# Patient Record
Sex: Male | Born: 1990 | Race: White | Hispanic: No | Marital: Single | Smoking: Current every day smoker
Health system: Southern US, Community
[De-identification: ages and names within clinical notes are randomized; demographics above are authoritative.]

## PROBLEM LIST (undated history)

## (undated) HISTORY — PX: FEMUR FRACTURE SURGERY: SHX633

---
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
## 2010-05-09 LAB — URINE CULTURE

## 2010-05-09 LAB — URINALYSIS, MICROSCOPIC ONLY
Bilirubin Urine: NEGATIVE
Hgb urine dipstick: NEGATIVE
Ketones, ur: NEGATIVE mg/dL
Specific Gravity, Urine: 1.02 (ref 1.005–1.030)
Urobilinogen, UA: 1 mg/dL (ref 0.0–1.0)

## 2010-05-09 LAB — CROSSMATCH
ABO/RH(D): O POS
Antibody Screen: NEGATIVE

## 2010-05-09 LAB — CBC
HCT: 31.8 % — ABNORMAL LOW (ref 39.0–52.0)
Hemoglobin: 11.4 g/dL — ABNORMAL LOW (ref 13.0–17.0)
MCHC: 35.3 g/dL (ref 30.0–36.0)
Platelets: 336 10*3/uL (ref 150–400)
RDW: 12.5 % (ref 11.5–15.5)
RDW: 12.6 % (ref 11.5–15.5)
WBC: 10.3 10*3/uL (ref 4.0–10.5)

## 2010-05-09 LAB — LACTIC ACID, PLASMA: Lactic Acid, Venous: 1.7 mmol/L (ref 0.5–2.2)

## 2010-05-09 LAB — PROTIME-INR: Prothrombin Time: 12.3 seconds (ref 11.6–15.2)
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
## 2014-06-13 ENCOUNTER — Ambulatory Visit (INDEPENDENT_AMBULATORY_CARE_PROVIDER_SITE_OTHER): Payer: Worker's Compensation | Admitting: Nurse Practitioner

## 2014-06-13 DIAGNOSIS — Z23 Encounter for immunization: Secondary | ICD-10-CM | POA: Diagnosis not present

## 2014-06-13 NOTE — Progress Notes (Signed)
Tenivac given and tolerated well.

## 2014-06-13 NOTE — Patient Instructions (Signed)
Td Vaccine (Tetanus and Diphtheria): What You Need to Know 1. Why get vaccinated? Tetanus  and diphtheria are very serious diseases. They are rare in the [place] today, but people who do become infected often have severe complications. Td vaccine is used to protect adolescents and adults from both of these diseases. Both tetanus and diphtheria are infections caused by bacteria. Diphtheria spreads from person to person through coughing or sneezing. Tetanus-causing bacteria enter the body through cuts, scratches, or wounds. TETANUS (Lockjaw) causes painful muscle tightening and stiffness, usually all over the body.  It can lead to tightening of muscles in the head and neck so you can't open your mouth, swallow, or sometimes even breathe. Tetanus kills about 1 out of every 5 people who are infected. DIPHTHERIA can cause a thick coating to form in the back of the throat.  It can lead to breathing problems, paralysis, heart failure, and death. Before vaccines, the [place] saw as many as 200,000 cases a year of diphtheria and hundreds of cases of tetanus. Since vaccination began, cases of both diseases have dropped by about 99%. 2. Td vaccine Td vaccine can protect adolescents and adults from tetanus and diphtheria. Td is usually given as a booster dose every 10 years but it can also be given earlier after a severe and dirty wound or burn. Your doctor can give you more information. Td may safely be given at the same time as other vaccines. 3. Some people should not get this vaccine  If you ever had a life-threatening allergic reaction after a dose of any tetanus or diphtheria containing vaccine, OR if you have a severe allergy to any part of this vaccine, you should not get Td. Tell your doctor if you have any severe allergies.  Talk to your doctor if you:  have epilepsy or another nervous system problem,  had severe pain or swelling after any vaccine containing diphtheria or  tetanus,  ever had Guillain Barr Syndrome (GBS),  aren't feeling well on the day the shot is scheduled. 4. Risks of a vaccine reaction With a vaccine, like any medicine, there is a chance of side effects. These are usually mild and go away on their own. Serious side effects are also possible, but are very rare. Most people who get Td vaccine do not have any problems with it. Mild Problems  following Td (Did not interfere with activities)  Pain where the shot was given (about 8 people in 10)  Redness or swelling where the shot was given (about 1 person in 3)  Mild fever (about 1 person in 15)  Headache or Tiredness (uncommon) Moderate Problems following Td (Interfered with activities, but did not require medical attention)  Fever over 102F (rare) Severe Problems  following Td (Unable to perform usual activities; required medical attention)  Swelling, severe pain, bleeding and/or redness in the arm where the shot was given (rare). Problems that could happen after any vaccine:  Brief fainting spells can happen after any medical procedure, including vaccination. Sitting or lying down for about 15 minutes can help prevent fainting, and injuries caused by a fall. Tell your doctor if you feel dizzy, or have vision changes or ringing in the ears.  Severe shoulder pain and reduced range of motion in the arm where a shot was given can happen, very rarely, after a vaccination.  Severe allergic reactions from a vaccine are very rare, estimated at less than 1 in a million doses. If one were to occur,   it would usually be within a few minutes to a few hours after the vaccination. 5. What if there is a serious reaction? What should I look for?  Look for anything that concerns you, such as signs of a severe allergic reaction, very high fever, or behavior changes. Signs of a severe allergic reaction can include hives, swelling of the face and throat, difficulty breathing, a fast heartbeat,  dizziness, and weakness. These would usually start a few minutes to a few hours after the vaccination. What should I do?  If you think it is a severe allergic reaction or other emergency that can't wait, call 9-1-1 or get the person to the nearest hospital. Otherwise, call your doctor.  Afterward, the reaction should be reported to the Vaccine Adverse Event Reporting System (VAERS). Your doctor might file this report, or you can do it yourself through the VAERS web site at www.vaers.hhs.gov, or by calling 1-800-822-7967. VAERS is only for reporting reactions. They do not give medical advice. 6. The National Vaccine Injury Compensation Program The National Vaccine Injury Compensation Program (VICP) is a federal program that was created to compensate people who may have been injured by certain vaccines. Persons who believe they may have been injured by a vaccine can learn about the program and about filing a claim by calling 1-800-338-2382 or visiting the VICP website at www.hrsa.gov/vaccinecompensation. 7. How can I learn more?  Ask your doctor.  Contact your local or state health department.  Contact the Centers for Disease Control and Prevention (CDC):  Call 1-800-232-4636 (1-800-CDC-INFO)  Visit CDC's website at www.cdc.gov/vaccines CDC Td Vaccine Interim VIS (03/07/12) Document Released: 11/15/2005 Document Revised: 06/04/2013 Document Reviewed: 05/02/2013 ExitCare Patient Information 2015 [employer]. This information is not intended to replace advice given to you by your health care provider. Make sure you discuss any questions you have with your health care provider.  

## 2020-06-10 ENCOUNTER — Emergency Department (HOSPITAL_COMMUNITY)
Admission: EM | Admit: 2020-06-10 | Discharge: 2020-06-10 | Disposition: A | Discharge status: Home / Self Care | Payer: Self-pay | Attending: Emergency Medicine | Admitting: Emergency Medicine

## 2020-06-10 ENCOUNTER — Encounter (HOSPITAL_COMMUNITY): Payer: Self-pay | Admitting: *Deleted

## 2020-06-10 ENCOUNTER — Other Ambulatory Visit: Payer: Self-pay

## 2020-06-10 DIAGNOSIS — F17200 Nicotine dependence, unspecified, uncomplicated: Secondary | ICD-10-CM | POA: Insufficient documentation

## 2020-06-10 DIAGNOSIS — F1510 Other stimulant abuse, uncomplicated: Secondary | ICD-10-CM | POA: Insufficient documentation

## 2020-06-10 DIAGNOSIS — F1010 Alcohol abuse, uncomplicated: Secondary | ICD-10-CM | POA: Insufficient documentation

## 2020-06-10 DIAGNOSIS — R000 Tachycardia, unspecified: Secondary | ICD-10-CM | POA: Insufficient documentation

## 2020-06-10 DIAGNOSIS — Y908 Blood alcohol level of 240 mg/100 ml or more: Secondary | ICD-10-CM | POA: Insufficient documentation

## 2020-06-10 DIAGNOSIS — F1910 Other psychoactive substance abuse, uncomplicated: Secondary | ICD-10-CM

## 2020-06-10 DIAGNOSIS — Z20822 Contact with and (suspected) exposure to covid-19: Secondary | ICD-10-CM | POA: Insufficient documentation

## 2020-06-10 DIAGNOSIS — F1110 Opioid abuse, uncomplicated: Secondary | ICD-10-CM | POA: Insufficient documentation

## 2020-06-10 LAB — CBC WITH DIFFERENTIAL/PLATELET
Abs Immature Granulocytes: 0.22 10*3/uL — ABNORMAL HIGH (ref 0.00–0.07)
Basophils Absolute: 0.2 10*3/uL — ABNORMAL HIGH (ref 0.0–0.1)
Basophils Relative: 2 %
Eosinophils Absolute: 0.6 10*3/uL — ABNORMAL HIGH (ref 0.0–0.5)
Eosinophils Relative: 5 %
HCT: 47.3 % (ref 39.0–52.0)
Hemoglobin: 16.2 g/dL (ref 13.0–17.0)
Immature Granulocytes: 2 %
Lymphocytes Relative: 34 %
Lymphs Abs: 3.9 10*3/uL (ref 0.7–4.0)
MCH: 32.6 pg (ref 26.0–34.0)
MCHC: 34.2 g/dL (ref 30.0–36.0)
MCV: 95.2 fL (ref 80.0–100.0)
Monocytes Absolute: 0.7 10*3/uL (ref 0.1–1.0)
Monocytes Relative: 6 %
Neutro Abs: 6.1 10*3/uL (ref 1.7–7.7)
Neutrophils Relative %: 51 %
Platelets: 416 10*3/uL — ABNORMAL HIGH (ref 150–400)
RBC: 4.97 MIL/uL (ref 4.22–5.81)
RDW: 12.7 % (ref 11.5–15.5)
WBC: 11.6 10*3/uL — ABNORMAL HIGH (ref 4.0–10.5)
nRBC: 0 % (ref 0.0–0.2)

## 2020-06-10 LAB — RESP PANEL BY RT-PCR (FLU A&B, COVID) ARPGX2
Influenza A by PCR: NEGATIVE
Influenza B by PCR: NEGATIVE
SARS Coronavirus 2 by RT PCR: NEGATIVE

## 2020-06-10 LAB — COMPREHENSIVE METABOLIC PANEL
ALT: 37 U/L (ref 0–44)
AST: 29 U/L (ref 15–41)
Albumin: 3.9 g/dL (ref 3.5–5.0)
Alkaline Phosphatase: 60 U/L (ref 38–126)
Anion gap: 10 (ref 5–15)
BUN: 5 mg/dL — ABNORMAL LOW (ref 6–20)
CO2: 27 mmol/L (ref 22–32)
Calcium: 8.5 mg/dL — ABNORMAL LOW (ref 8.9–10.3)
Chloride: 103 mmol/L (ref 98–111)
Creatinine, Ser: 0.69 mg/dL (ref 0.61–1.24)
GFR, Estimated: >60 mL/min (ref >60)
Glucose, Bld: 109 mg/dL — ABNORMAL HIGH (ref 70–99)
Potassium: 3.8 mmol/L (ref 3.5–5.1)
Sodium: 140 mmol/L (ref 135–145)
Total Bilirubin: 0.6 mg/dL (ref 0.3–1.2)
Total Protein: 7.6 g/dL (ref 6.5–8.1)

## 2020-06-10 LAB — ETHANOL: Alcohol, Ethyl (B): 277 mg/dL — ABNORMAL HIGH (ref <10)

## 2020-06-10 NOTE — ED Provider Notes (Signed)
[facility] EMERGENCY DEPARTMENT Provider Note   CSN: [id number] Arrival date & time: 06/10/20  1146     History Chief Complaint  Patient presents with  . Addiction Problem    Barry Randolph is a 30 y.o. male.  HPI   Patient with no significant medical history presents to the emergency department with chief complaint of needing to be admitted due to drug abuse.  He endorses that he has been using fentanyl and methamphetamine and he is concerned that if he does not stop he is going to go to jail.  He states that he may or may not be in trouble with the police but he is unable to elaborate.  He denies suicidal or homicidal ideations, denies hallucinations or delusions.  He states the last time he used fentanyl was approximately 2 days ago, states he used to use IV drugs but has not done in a very long time.  He has no other complaints at this time.  Patient denies headaches, fevers, chills, shortness of breath, chest pain, abdominal pain, nausea, vomiting, diarrhea, worsening pedal edema.  History reviewed. No pertinent past medical history.  There are no problems to display for this patient.   Past Surgical History:  Procedure Laterality Date  . FEMUR FRACTURE SURGERY Right    rod and 2 screws in right upper leg       No family history on file.  Social History   Tobacco Use  . Smoking status: Current Every Day Smoker    Packs/day: 2.00  . Smokeless tobacco: Never Used  Vaping Use  . Vaping Use: Never used  Substance Use Topics  . Alcohol use: Yes    Alcohol/week: 84.0 standard drinks    Types: 84 Cans of beer per week    Comment: 12 pack daily  . Drug use: Yes    Types: Methamphetamines    Comment: Fentanyl    Home Medications Prior to Admission medications   Not on File    Allergies    Sulfa antibiotics  Review of Systems   Review of Systems  Constitutional: Negative for chills and fever.  HENT: Negative for congestion.   Respiratory: Negative for  shortness of breath.   Cardiovascular: Negative for chest pain.  Gastrointestinal: Negative for abdominal pain.  Genitourinary: Negative for enuresis.  Musculoskeletal: Negative for back pain.  Skin: Negative for rash.  Neurological: Negative for dizziness.  Hematological: Does not bruise/bleed easily.  Psychiatric/Behavioral: Negative for agitation, hallucinations, self-injury and suicidal ideas. The patient is not nervous/anxious.     Physical Exam Updated Vital Signs BP 122/81   Pulse (!) 116   Temp 98.1 F (36.7 C) (Oral)   Resp 20   Ht 5\' 2"  (1.575 m)   Wt 63.5 kg   SpO2 98%   BMI 25.61 kg/m   Physical Exam Vitals and nursing note reviewed.  Constitutional:      General: He is not in acute distress.    Appearance: He is not ill-appearing.  HENT:     Head: Normocephalic and atraumatic.     Nose: No congestion.  Eyes:     Conjunctiva/sclera: Conjunctivae normal.  Cardiovascular:     Rate and Rhythm: Regular rhythm. Tachycardia present.     Pulses: Normal pulses.     Heart sounds: No murmur heard. No friction rub. No gallop.   Pulmonary:     Effort: No respiratory distress.     Breath sounds: No wheezing, rhonchi or rales.  Abdominal:  Palpations: Abdomen is soft.     Tenderness: There is no abdominal tenderness.  Musculoskeletal:     Right lower leg: No edema.     Left lower leg: No edema.  Skin:    General: Skin is warm and dry.  Neurological:     Mental Status: He is alert.  Psychiatric:        Mood and Affect: Mood normal.     ED Results / Procedures / Treatments   Labs (all labs ordered are listed, but only abnormal results are displayed) Labs Reviewed  COMPREHENSIVE METABOLIC PANEL - Abnormal; Notable for the following components:      Result Value   Glucose, Bld 109 (*)    BUN 5 (*)    Calcium 8.5 (*)    All other components within normal limits  ETHANOL - Abnormal; Notable for the following components:   Alcohol, Ethyl (B) 277 (*)     All other components within normal limits  CBC WITH DIFFERENTIAL/PLATELET - Abnormal; Notable for the following components:   WBC 11.6 (*)    Platelets 416 (*)    Eosinophils Absolute 0.6 (*)    Basophils Absolute 0.2 (*)    Abs Immature Granulocytes 0.22 (*)    All other components within normal limits  RESP PANEL BY RT-PCR (FLU A&B, COVID) ARPGX2  RAPID URINE DRUG SCREEN, HOSP PERFORMED    EKG None  Radiology No results found.  Procedures Procedures   Medications Ordered in ED Medications - No data to display  ED Course  I have reviewed the triage vital signs and the nursing notes.  Pertinent labs & imaging results that were available during my care of the patient were reviewed by me and considered in my medical decision making (see chart for details).    MDM Rules/Calculators/A&P                         Initial impression-patient presents with concerns of drug abuse.  He is alert, does not appear to be in acute distress, vital signs shows tachycardia.  Will obtain med screening work-up, consult TTS and reassess.  Work-up-CBC shows leukocytosis 11.6, CMP shows hyperglycemia 109, BUN less than 5, respiratory panel negative, ethanol 277, EKG sinus tach without signs of ischemia  Reassessment nursing staff notified me that patient like to go home.  Went to assess the patient, he states he would like to go home he has been waiting here for more than 5 hours and does not want to wait here any longer.  He denies suicidal homicidal ideations, denies hallucination delusions, he is alert and oriented x4, he is aware that he is leaving against medical advice and he resumes all liability at this time as he is leaving before being fully evaluated.  Rule out- Low suspicion for systemic infection as patient is nontoxic-appearing, no obvious source of infection on my exam.  Patient was noted to be tachycardic but I suspect this may be secondary due to substance abuse as he has alcohol  content of 277.  Low suspicion for ACS as patient has chest pain, shortness of breath, EKG is without signs of ischemia.  Low suspicion for PE as patient denies shortness of breath, pleuritic chest pain, patient has no no edema on my exam, no recent travels, patient is low risk factors.  Low suspicion for intra-abdominal abnormality as patient denies abdominal pain, abdomen soft nontender to palpation.  Low suspicion for psychiatric emergency as patient denies  hallucinations or delusions, denies suicidal or homicidal ideations.  Plan-  Patient is leaving AMA, provide him with outpatient resources for drug rehabilitation, recommend that he comes back if symptoms deteriorate or if he changes his mind.  Vital signs have remained stable, no indication for hospital admission.  Patient given at home care as well strict return precautions.  Patient verbalized that they understood agreed to said plan.   Final Clinical Impression(s) / ED Diagnoses Final diagnoses:  Polysubstance abuse [facility])    Rx / DC Orders ED Discharge Orders    None       Barnie Del 06/10/20 2140    Jacalyn Lefevre, MD 06/11/20 [phone]

## 2020-06-10 NOTE — Discharge Instructions (Addendum)
You are leaving against medical advice, if you are to change her mind please come back to emergency department for a full evaluation.  Come back to the emergency department if you develop chest pain, shortness of breath, severe abdominal pain, uncontrolled nausea, vomiting, diarrhea.

## 2020-06-10 NOTE — ED Notes (Signed)
Pt leaving AMA. Pt stated he did not want to stay any longer.  Chrissie Noa PA went to talk to pt. Pt signed AMA signature and ambulatory up hall to leave facility

## 2020-06-10 NOTE — ED Triage Notes (Signed)
Pt brought in by mother stating "I need help". When asked what he needs help with pt states, "Substance abuse. I take Fentanyl and Methamphetamines. Pt reports last using them 2 days ago. Denies SI/HI.

## 2021-04-26 ENCOUNTER — Emergency Department (HOSPITAL_COMMUNITY): Payer: Self-pay

## 2021-04-26 ENCOUNTER — Inpatient Hospital Stay (HOSPITAL_COMMUNITY)
Admission: EM | Admit: 2021-04-26 | Discharge: 2021-04-27 | DRG: 871 | Discharge status: Against Medical Advice | Payer: Self-pay | Attending: Internal Medicine | Admitting: Internal Medicine

## 2021-04-26 ENCOUNTER — Other Ambulatory Visit: Payer: Self-pay

## 2021-04-26 ENCOUNTER — Encounter (HOSPITAL_COMMUNITY): Payer: Self-pay | Admitting: *Deleted

## 2021-04-26 DIAGNOSIS — F17210 Nicotine dependence, cigarettes, uncomplicated: Secondary | ICD-10-CM | POA: Diagnosis present

## 2021-04-26 DIAGNOSIS — S92345A Nondisplaced fracture of fourth metatarsal bone, left foot, initial encounter for closed fracture: Secondary | ICD-10-CM

## 2021-04-26 DIAGNOSIS — F17200 Nicotine dependence, unspecified, uncomplicated: Secondary | ICD-10-CM

## 2021-04-26 DIAGNOSIS — E872 Acidosis, unspecified: Secondary | ICD-10-CM | POA: Diagnosis present

## 2021-04-26 DIAGNOSIS — R7401 Elevation of levels of liver transaminase levels: Secondary | ICD-10-CM | POA: Diagnosis present

## 2021-04-26 DIAGNOSIS — F1510 Other stimulant abuse, uncomplicated: Secondary | ICD-10-CM

## 2021-04-26 DIAGNOSIS — F1010 Alcohol abuse, uncomplicated: Secondary | ICD-10-CM

## 2021-04-26 DIAGNOSIS — Z716 Tobacco abuse counseling: Secondary | ICD-10-CM

## 2021-04-26 DIAGNOSIS — F1110 Opioid abuse, uncomplicated: Secondary | ICD-10-CM

## 2021-04-26 DIAGNOSIS — F1910 Other psychoactive substance abuse, uncomplicated: Secondary | ICD-10-CM | POA: Diagnosis present

## 2021-04-26 DIAGNOSIS — Z882 Allergy status to sulfonamides status: Secondary | ICD-10-CM

## 2021-04-26 DIAGNOSIS — W1830XA Fall on same level, unspecified, initial encounter: Secondary | ICD-10-CM | POA: Diagnosis present

## 2021-04-26 DIAGNOSIS — R6520 Severe sepsis without septic shock: Secondary | ICD-10-CM | POA: Diagnosis present

## 2021-04-26 DIAGNOSIS — Z7151 Drug abuse counseling and surveillance of drug abuser: Secondary | ICD-10-CM

## 2021-04-26 DIAGNOSIS — J690 Pneumonitis due to inhalation of food and vomit: Secondary | ICD-10-CM

## 2021-04-26 DIAGNOSIS — F10930 Alcohol use, unspecified with withdrawal, uncomplicated: Secondary | ICD-10-CM

## 2021-04-26 DIAGNOSIS — Z20822 Contact with and (suspected) exposure to covid-19: Secondary | ICD-10-CM | POA: Diagnosis present

## 2021-04-26 DIAGNOSIS — R55 Syncope and collapse: Secondary | ICD-10-CM

## 2021-04-26 DIAGNOSIS — F10230 Alcohol dependence with withdrawal, uncomplicated: Secondary | ICD-10-CM

## 2021-04-26 DIAGNOSIS — J181 Lobar pneumonia, unspecified organism: Secondary | ICD-10-CM

## 2021-04-26 DIAGNOSIS — B029 Zoster without complications: Secondary | ICD-10-CM | POA: Diagnosis present

## 2021-04-26 DIAGNOSIS — E538 Deficiency of other specified B group vitamins: Secondary | ICD-10-CM | POA: Diagnosis present

## 2021-04-26 DIAGNOSIS — Z79899 Other long term (current) drug therapy: Secondary | ICD-10-CM

## 2021-04-26 DIAGNOSIS — B028 Zoster with other complications: Secondary | ICD-10-CM

## 2021-04-26 DIAGNOSIS — F1020 Alcohol dependence, uncomplicated: Secondary | ICD-10-CM | POA: Diagnosis present

## 2021-04-26 DIAGNOSIS — A419 Sepsis, unspecified organism: Principal | ICD-10-CM

## 2021-04-26 DIAGNOSIS — J189 Pneumonia, unspecified organism: Secondary | ICD-10-CM

## 2021-04-26 LAB — URINALYSIS, ROUTINE W REFLEX MICROSCOPIC
Bacteria, UA: NONE SEEN
Bilirubin Urine: NEGATIVE
Glucose, UA: 150 mg/dL — AB
Ketones, ur: NEGATIVE mg/dL
Leukocytes,Ua: NEGATIVE
Nitrite: NEGATIVE
Protein, ur: 100 mg/dL — AB
Specific Gravity, Urine: 1.015 (ref 1.005–1.030)
pH: 8 (ref 5.0–8.0)

## 2021-04-26 LAB — BLOOD GAS, VENOUS
Acid-base deficit: 0.4 mmol/L (ref 0.0–2.0)
Bicarbonate: 24.8 mmol/L (ref 20.0–28.0)
Drawn by: 6509
O2 Saturation: 70 %
Patient temperature: 37.6
pCO2, Ven: 43 mmHg — ABNORMAL LOW (ref 44–60)
pH, Ven: 7.37 (ref 7.25–7.43)
pO2, Ven: 42 mmHg (ref 32–45)

## 2021-04-26 LAB — CBC WITH DIFFERENTIAL/PLATELET
Abs Immature Granulocytes: 0.11 10*3/uL — ABNORMAL HIGH (ref 0.00–0.07)
Basophils Absolute: 0.1 10*3/uL (ref 0.0–0.1)
Basophils Relative: 1 %
Eosinophils Absolute: 0.3 10*3/uL (ref 0.0–0.5)
Eosinophils Relative: 1 %
HCT: 46.2 % (ref 39.0–52.0)
Hemoglobin: 15.4 g/dL (ref 13.0–17.0)
Immature Granulocytes: 1 %
Lymphocytes Relative: 8 %
Lymphs Abs: 1.4 10*3/uL (ref 0.7–4.0)
MCH: 31.8 pg (ref 26.0–34.0)
MCHC: 33.3 g/dL (ref 30.0–36.0)
MCV: 95.5 fL (ref 80.0–100.0)
Monocytes Absolute: 1.2 10*3/uL — ABNORMAL HIGH (ref 0.1–1.0)
Monocytes Relative: 6 %
Neutro Abs: 15 10*3/uL — ABNORMAL HIGH (ref 1.7–7.7)
Neutrophils Relative %: 83 %
Platelets: 325 10*3/uL (ref 150–400)
RBC: 4.84 MIL/uL (ref 4.22–5.81)
RDW: 12.8 % (ref 11.5–15.5)
WBC: 18 10*3/uL — ABNORMAL HIGH (ref 4.0–10.5)
nRBC: 0 % (ref 0.0–0.2)

## 2021-04-26 LAB — COMPREHENSIVE METABOLIC PANEL
ALT: 103 U/L — ABNORMAL HIGH (ref 0–44)
AST: 309 U/L — ABNORMAL HIGH (ref 15–41)
Albumin: 3.6 g/dL (ref 3.5–5.0)
Alkaline Phosphatase: 65 U/L (ref 38–126)
Anion gap: 10 (ref 5–15)
BUN: 14 mg/dL (ref 6–20)
CO2: 27 mmol/L (ref 22–32)
Calcium: 9 mg/dL (ref 8.9–10.3)
Chloride: 96 mmol/L — ABNORMAL LOW (ref 98–111)
Creatinine, Ser: 0.95 mg/dL (ref 0.61–1.24)
GFR, Estimated: >60 mL/min (ref >60)
Glucose, Bld: 112 mg/dL — ABNORMAL HIGH (ref 70–99)
Potassium: 3.8 mmol/L (ref 3.5–5.1)
Sodium: 133 mmol/L — ABNORMAL LOW (ref 135–145)
Total Bilirubin: 0.5 mg/dL (ref 0.3–1.2)
Total Protein: 7.9 g/dL (ref 6.5–8.1)

## 2021-04-26 LAB — RESP PANEL BY RT-PCR (FLU A&B, COVID) ARPGX2
Influenza A by PCR: NEGATIVE
Influenza B by PCR: NEGATIVE
SARS Coronavirus 2 by RT PCR: NEGATIVE

## 2021-04-26 LAB — MRSA NEXT GEN BY PCR, NASAL: MRSA by PCR Next Gen: NOT DETECTED

## 2021-04-26 LAB — RAPID URINE DRUG SCREEN, HOSP PERFORMED
Amphetamines: POSITIVE — AB
Barbiturates: NOT DETECTED
Benzodiazepines: NOT DETECTED
Cocaine: NOT DETECTED
Opiates: NOT DETECTED
Tetrahydrocannabinol: POSITIVE — AB

## 2021-04-26 LAB — HEPATITIS PANEL, ACUTE
HCV Ab: NONREACTIVE
Hep A IgM: NONREACTIVE
Hep B C IgM: NONREACTIVE
Hepatitis B Surface Ag: NONREACTIVE

## 2021-04-26 LAB — ETHANOL: Alcohol, Ethyl (B): <10 mg/dL (ref <10)

## 2021-04-26 LAB — LACTIC ACID, PLASMA
Lactic Acid, Venous: 2.7 mmol/L (ref 0.5–1.9)
Lactic Acid, Venous: 2.2 mmol/L (ref 0.5–1.9)

## 2021-04-26 MED ORDER — HEPARIN SODIUM (PORCINE) 5000 UNIT/ML IJ SOLN
5000.0000 [IU] | Freq: Three times a day (TID) | INTRAMUSCULAR | Status: DC
  Administered 2021-04-26 – 2021-04-27 (×2): 5000 [IU] via SUBCUTANEOUS
  Filled 2021-04-26 (×2): qty 1

## 2021-04-26 MED ORDER — SODIUM CHLORIDE 0.9 % IV SOLN
INTRAVENOUS | Status: AC
  Administered 2021-04-26: 1000 mL via INTRAVENOUS

## 2021-04-26 MED ORDER — SODIUM CHLORIDE 0.9 % IV BOLUS
1000.0000 mL | Freq: Once | INTRAVENOUS | Status: AC
  Administered 2021-04-26: 1000 mL via INTRAVENOUS

## 2021-04-26 MED ORDER — THIAMINE HCL 100 MG/ML IJ SOLN
100.0000 mg | Freq: Every day | INTRAMUSCULAR | Status: DC
  Filled 2021-04-26: qty 2

## 2021-04-26 MED ORDER — KETOROLAC TROMETHAMINE 30 MG/ML IJ SOLN
30.0000 mg | Freq: Four times a day (QID) | INTRAMUSCULAR | Status: DC | PRN
  Administered 2021-04-26: 30 mg via INTRAVENOUS
  Filled 2021-04-26: qty 1

## 2021-04-26 MED ORDER — OXYCODONE HCL 5 MG PO TABS: 5.0000 mg | ORAL_TABLET | ORAL | Status: DC | PRN

## 2021-04-26 MED ORDER — LORAZEPAM 1 MG PO TABS
0.0000 mg | ORAL_TABLET | Freq: Four times a day (QID) | ORAL | Status: DC
  Administered 2021-04-26: 1 mg via ORAL
  Administered 2021-04-26: 2 mg via ORAL
  Administered 2021-04-26 – 2021-04-27 (×2): 1 mg via ORAL
  Filled 2021-04-26 (×2): qty 1
  Filled 2021-04-26: qty 2

## 2021-04-26 MED ORDER — SODIUM CHLORIDE 0.9 % IV SOLN
500.0000 mg | INTRAVENOUS | Status: DC
  Administered 2021-04-26: 500 mg via INTRAVENOUS
  Filled 2021-04-26: qty 5

## 2021-04-26 MED ORDER — IOHEXOL 350 MG/ML SOLN
75.0000 mL | Freq: Once | INTRAVENOUS | Status: AC | PRN
  Administered 2021-04-26: 75 mL via INTRAVENOUS

## 2021-04-26 MED ORDER — VITAMIN B-12 100 MCG PO TABS
100.0000 ug | ORAL_TABLET | Freq: Every day | ORAL | Status: DC
  Administered 2021-04-26 – 2021-04-27 (×2): 100 ug via ORAL
  Filled 2021-04-26 (×4): qty 1

## 2021-04-26 MED ORDER — ALBUTEROL SULFATE (2.5 MG/3ML) 0.083% IN NEBU: 2.5000 mg | INHALATION_SOLUTION | RESPIRATORY_TRACT | Status: DC | PRN

## 2021-04-26 MED ORDER — LORAZEPAM 2 MG/ML IJ SOLN
0.0000 mg | Freq: Four times a day (QID) | INTRAMUSCULAR | Status: DC
  Filled 2021-04-26: qty 1

## 2021-04-26 MED ORDER — ACETAMINOPHEN 650 MG RE SUPP: 650.0000 mg | Freq: Four times a day (QID) | RECTAL | Status: DC | PRN

## 2021-04-26 MED ORDER — NICOTINE 21 MG/24HR TD PT24
21.0000 mg | MEDICATED_PATCH | Freq: Every day | TRANSDERMAL | Status: DC
  Administered 2021-04-26 – 2021-04-27 (×2): 21 mg via TRANSDERMAL
  Filled 2021-04-26 (×2): qty 1

## 2021-04-26 MED ORDER — SODIUM CHLORIDE 0.9 % IV SOLN: 2.0000 g | INTRAVENOUS | Status: DC

## 2021-04-26 MED ORDER — ONDANSETRON HCL 4 MG/2ML IJ SOLN
4.0000 mg | Freq: Four times a day (QID) | INTRAMUSCULAR | Status: DC | PRN
  Administered 2021-04-26: 4 mg via INTRAVENOUS
  Filled 2021-04-26: qty 2

## 2021-04-26 MED ORDER — ACETAMINOPHEN 325 MG PO TABS
650.0000 mg | ORAL_TABLET | Freq: Four times a day (QID) | ORAL | Status: DC | PRN
  Administered 2021-04-26 – 2021-04-27 (×2): 650 mg via ORAL
  Filled 2021-04-26 (×2): qty 2

## 2021-04-26 MED ORDER — SODIUM CHLORIDE 0.9 % IV SOLN
2.0000 g | Freq: Once | INTRAVENOUS | Status: AC
  Administered 2021-04-26: 2 g via INTRAVENOUS
  Filled 2021-04-26: qty 20

## 2021-04-26 MED ORDER — LORAZEPAM 2 MG/ML IJ SOLN: 0.0000 mg | Freq: Two times a day (BID) | INTRAMUSCULAR | Status: DC

## 2021-04-26 MED ORDER — ONDANSETRON HCL 4 MG PO TABS: 4.0000 mg | ORAL_TABLET | Freq: Four times a day (QID) | ORAL | Status: DC | PRN

## 2021-04-26 MED ORDER — SODIUM CHLORIDE 0.9 % IV SOLN: INTRAVENOUS | Status: DC

## 2021-04-26 MED ORDER — VALACYCLOVIR HCL 500 MG PO TABS
1000.0000 mg | ORAL_TABLET | Freq: Three times a day (TID) | ORAL | Status: DC
  Administered 2021-04-26 – 2021-04-27 (×3): 1000 mg via ORAL
  Filled 2021-04-26 (×10): qty 2

## 2021-04-26 MED ORDER — LORAZEPAM 1 MG PO TABS: 0.0000 mg | ORAL_TABLET | Freq: Two times a day (BID) | ORAL | Status: DC

## 2021-04-26 MED ORDER — GABAPENTIN 100 MG PO CAPS
100.0000 mg | ORAL_CAPSULE | Freq: Three times a day (TID) | ORAL | Status: DC
Start: 2021-04-26 — End: 2021-04-27
  Administered 2021-04-26 – 2021-04-27 (×2): 100 mg via ORAL
  Filled 2021-04-26 (×2): qty 1

## 2021-04-26 MED ORDER — MELATONIN 3 MG PO TABS
6.0000 mg | ORAL_TABLET | Freq: Every evening | ORAL | Status: DC | PRN
  Administered 2021-04-27: 6 mg via ORAL
  Filled 2021-04-26: qty 2

## 2021-04-26 MED ORDER — THIAMINE HCL 100 MG PO TABS
100.0000 mg | ORAL_TABLET | Freq: Every day | ORAL | Status: DC
  Administered 2021-04-26 – 2021-04-27 (×2): 100 mg via ORAL
  Filled 2021-04-26 (×2): qty 1

## 2021-04-26 MED ORDER — SODIUM CHLORIDE 0.9 % IV BOLUS
500.0000 mL | Freq: Once | INTRAVENOUS | Status: AC
  Administered 2021-04-26: 500 mL via INTRAVENOUS

## 2021-04-26 MED ORDER — CHLORHEXIDINE GLUCONATE CLOTH 2 % EX PADS
6.0000 | MEDICATED_PAD | Freq: Every day | CUTANEOUS | Status: DC
  Administered 2021-04-26: 6 via TOPICAL

## 2021-04-26 NOTE — Plan of Care (Signed)
Discussed with patient plan of care for the evening, pain management and  admission question with some teach back displayed.  Contacted mother and updated her per patient request. ? ?Problem: Education: ?Goal: Knowledge of General Education information will improve ?Description: Including pain rating scale, medication(s)/side effects and non-pharmacologic comfort measures ?Outcome: Progressing ?  ?Problem: Health Behavior/Discharge Planning: ?Goal: Ability to manage health-related needs will improve ?Outcome: Progressing ?  ?

## 2021-04-26 NOTE — Assessment & Plan Note (Signed)
Febrile, tachycardic, with tachypnea and leukocytosis ?BP stable ?Lactic acidosis and transaminitis as well ?2/2 pneumonia seen on CTA ?Rocephin started in ED, add zithromax ?Sputum culture and blood culture pending ?Urine legionella and strep pending ?Check Procal with AM labs ?2L bolus in ED, continue IV hydration ?Tylenol for fever ?Continue to monitor ?

## 2021-04-26 NOTE — Assessment & Plan Note (Signed)
History of pernicious anemia?  ?Patient isn't sure why he was on B12 ?Continue B12 supplement and check B12 level ?If WNL - consider discontinuing B12 ?Hgb stable - likely hemoconcentrated in the setting of sepsis ?

## 2021-04-26 NOTE — Assessment & Plan Note (Addendum)
Vesicular rash on left shoulder ?Reports chicken pox in his youth ?Valtrex started in ED - continue ?Gabapentin added for pain control ?

## 2021-04-26 NOTE — Assessment & Plan Note (Signed)
Patient reports 6 overdoses on heroin in last year - unintentional per his report ?Consult TOC ?Today, THC and meth in UDS  ?Counseled on importance of cessation ?Patient reports that he is already working on quitting, and willing to consider rehab ?

## 2021-04-26 NOTE — H&P (Signed)
?History and Physical  ? ? ?Patient: Barry Randolph [id number] DOB: 08-21-1990 ?DOA: 04/26/2021 ?DOS: the patient was seen and examined on 04/26/2021 ?PCP: Pcp, No  ?Patient coming from: Home ? ?Chief Complaint:  ?Chief Complaint  ?Patient presents with  ? Shoulder Pain  ? ?HPI: Barry Randolph is a 31 y.o. male with medical history significant of with history of alcohol abuse and substance abuse as well as B12 deficiency presents to the ED with a chief complaint of " I thought I got jumped."  Patient reports that he does not remember anything from March 25.  He woke up around midnight and could not get out of bed his foot hurt and his shoulder hurt, as well as back pain.  There was blood surrounding him, it did not appear to be his blood.  Patient presented to ED for these reasons.  Patient reports that his mom had to help him out of bed.  He also reports that when he woke up he started to notice shortness of breath and cough productive of greenish sputum.  He felt feverish as well.  He denies any chest pain, but reports headache that seems to be associated with a cough.  Patient has no other complaints.  Patient reports he drinks 6-12 beers daily.  Since he cannot remember yesterday is hard for him to quantify how many beers he drank, but thinks that it was around 12, and he had twisted teas as well.  He denies any other drug use, but meth and marijuana are in his urine drug screen.  Patient reports that meth would not be out of the ordinary for him, but the last use he remembers was 1 month ago.  Patient denies any IV drug use, but reports that he has overdosed on heroin (snorting) 6 times in the last year.  He reports that these are unintentional overdoses.  He is currently trying to quit.  He is open to talking to social work about a rehab.  Patient reports he smokes 2 packs of cigarettes a day.  ? ?Patient reports that he is vaccinated for COVID, and that he is full code. ? ? ?Review of Systems: As mentioned  in the history of present illness. All other systems reviewed and are negative. ?History reviewed. No pertinent past medical history. ?Past Surgical History:  ?Procedure Laterality Date  ? FEMUR FRACTURE SURGERY Right   ? rod and 2 screws in right upper leg  ? ?Social History:  reports that he has been smoking cigarettes. He has been smoking an average of 2 packs per day. He has never used smokeless tobacco. He reports current alcohol use of about 84.0 standard drinks per week. He reports that he does not currently use drugs after having used the following drugs: Methamphetamines. ? ?Allergies  ?Allergen Reactions  ? Sulfa Antibiotics Hives  ? ? ?Family history: Patient does not know of any chronic diseases in his family aside from alcoholism. ? ?Prior to Admission medications   ?Medication Sig Start Date End Date Taking? Authorizing Provider  ?acetaminophen (TYLENOL) 500 MG tablet Take 1,000 mg by mouth every 6 (six) hours as needed.   Yes [provider]  ?Beta Carotene (VITAMIN A) 25000 UNIT capsule Take 25,000 Units by mouth daily.   Yes [provider]  ?vitamin B-12 (CYANOCOBALAMIN) 100 MCG tablet Take 100 mcg by mouth daily.   Yes [provider]  ? ? ?Physical Exam: ?Vitals:  ? 04/26/21 1434 04/26/21 1500 04/26/21 1600 04/26/21  1623  ?BP:  (!) 153/89 (!) 149/78   ?Pulse:  (!) 109 (!) 115   ?Resp:  (!) 23 (!) 22   ?Temp: 99.6 ?F (37.6 ?C)   (!) 101 ?F (38.3 ?C)  ?TempSrc:    Oral  ?SpO2:  92% 91%   ?Weight:      ?Height:      ? ?1.  General: ?Patient lying supine in bed, head of bed elevated, no acute distress ?  ?2. Psychiatric: ?Alert and oriented x 3, flat affect, behavior normal for situation, pleasant and cooperative with exam ?  ?3. Neurologic: ?Speech and language are normal, face is symmetric, moves all 4 extremities voluntarily, at baseline without acute deficits on limited exam ?  ?4. HEENMT:  ?Head is atraumatic, normocephalic, pupils reactive to light, neck is supple,  trachea is midline, mucous membranes are moist ?  ?5. Respiratory : ?Wheeze present bilaterally, no rhonchi, no rales, no cyanosis, no increase in work of breathing or accessory muscle use ?  ?6. Cardiovascular : ?Heart rate normal, rhythm is regular, no murmurs, rubs or gallops, no peripheral edema, peripheral pulses palpated ?  ?7. Gastrointestinal:  ?Abdomen is soft, nondistended, nontender to palpation bowel sounds active, no masses or organomegaly palpated ?  ?8. Skin:  ?Vesicular rash with erythematous base on the left scapula, does not cross midline ?  ?9.Musculoskeletal:  ?No acute deformities or trauma, no asymmetry in tone, no peripheral edema, peripheral pulses palpated, no tenderness to palpation in the extremities ? ?Data Reviewed: ?In the ED ?Up to 101, heart rate 108-117, respiratory rate 19-24, blood pressure 153/89 ?Lactic acid initially 2.7 and then 2.2 ?Leukocytosis to 18, hemoglobin 15.4, platelets 325 ?Slight hyponatremia 133, slight hypochloremia 96 ?AST 309, ALT 103 ?Negative COVID ?UA is not indicative of UTI ?UDS shows amphetamines and THC ?Chest x-ray shows no radiographic evidence of acute cardiopulmonary process ?CTA chest shows nodular airspace opacities that likely represent pneumonia and reactive lymph nodes ?X-ray left foot shows nondisplaced fracture of the fourth metatarsal head and similar fracture of the third metatarsal ?X-ray shoulder shows no osseous abnormality ?EKG shows sinus tach with a rate of 96, QTc 439 ?Alcohol level less than 10 ? ?Assessment and Plan: ?* Sepsis due to pneumonia [facility] ?Febrile, tachycardic, with tachypnea and leukocytosis ?BP stable ?Lactic acidosis and transaminitis as well ?2/2 pneumonia seen on CTA ?Rocephin started in ED, add zithromax ?Sputum culture and blood culture pending ?Urine legionella and strep pending ?Check Procal with AM labs ?2L bolus in ED, continue IV hydration ?Tylenol for fever ?Continue to monitor ? ?Tobacco use disorder ?Smokes  2 packs per day ?Nicotine patch ordered, advised that we can add a second patch if his cravings are not controlled  ?Advised importance of cessation ? ?B12 deficiency ?History of pernicious anemia?  ?Patient isn't sure why he was on B12 ?Continue B12 supplement and check B12 level ?If WNL - consider discontinuing B12 ?Hgb stable - likely hemoconcentrated in the setting of sepsis ? ?Transaminitis ?Likely multifactorial with alcohol use and sepsis both contributing ?Will check hepatitis panel as well ?Hold hepatotoxic agents when possible ?Trend with CMP in the AM - if no improvement - consider RUQ [place] ? ?Shingles ?Vesicular rash on left shoulder ?Reports chicken pox in his youth ?Valtrex started in ED - continue ?Gabapentin added for pain control ?Continue to monitor ? ?Substance abuse ([place]) ?Patient reports 6 overdoses on heroin in last year - unintentional per his report ?Consult TOC ?Today, THC and meth in  UDS  ?Counseled on importance of cessation ?Patient reports that he is already working on quitting, and willing to consider rehab ? ? ? ? ? Advance Care Planning:   Code Status: Full Code  ? ?Consults: Ortho and TOC ? ?Family Communication: Attempted to call mother Anderson Malta per patient request. No answer. Left VM.  ? ?Severity of Illness: ?The appropriate patient status for this patient is OBSERVATION. Observation status is judged to be reasonable and necessary in order to provide the required intensity of service to ensure the patient's safety. The patient's presenting symptoms, physical exam findings, and initial radiographic and laboratory data in the context of their medical condition is felt to place them at decreased risk for further clinical deterioration. Furthermore, it is anticipated that the patient will be medically stable for discharge from the hospital within 2 midnights of admission.  ? ?Author: ?Rolla Plate, DO ?04/26/2021 4:43 PM ? ?For on call review [email or web address].  ?

## 2021-04-26 NOTE — Assessment & Plan Note (Addendum)
Likely multifactorial with alcohol use and sepsis both contributing ?Viral hepatitis panel negative ?Hold hepatotoxic agents when possible ?RUQ [place] ?

## 2021-04-26 NOTE — ED Triage Notes (Signed)
Pt brought in by [facility] from home with c/o left shoulder pain and left foot swelling. VSS for EMS. Pt reports he was drinking last night and doesn't remember what happened. He woke up around midnight and says his head was covered in blood. Denies any drug use, only alcohol. Daily alcohol consumption of about 12 beers.  ?

## 2021-04-26 NOTE — ED Provider Notes (Signed)
?[facility] ?Provider Note ? ? ?CSN: [id number] ?Arrival date & time: 04/26/21  1009 ? ?  ? ?History ? ?Chief Complaint  ?Patient presents with  ? Shoulder Pain  ? ? ?Barry Randolph is a 31 y.o. male. ? ?HPI ? ?Patient with medical history including polysubstance dependency, alcohol dependency presents to the emergency department with complaint of a fall.  Patient states that he drank a 12 pack of beer last night and thinks he had a fall.  He states he woke up with blood around his face and endorsing severe left shoulder pain and right foot pain.  He states he is unclear of how he fell, he is unclear if he lost consciousness, he has not endorsing any headaches change in vision paresthesias or weakness in the upper or lower extremities, he does not endorse any neck or back pain, denies any chest pain or pleuritic chest pain.  No stomach pains nausea or vomiting.  He denies any suicidal homicidal ideations denies any hallucinations or delusions, states he is never had to be hospitalized for alcohol withdrawals in the past.  He is noting that he has been having a productive cough started today as well as fevers and chills he is concerned that he might have pneumonia.  He also states he has a rash on his left shoulder and thinks he might be shingles rash has been there for last couple days describes a burning-like sensation does not itch has no other rashes anywhere else. ? ?I have reviewed patient's chart he has been seen in the past at different hospitals facilities for polysubstance abuse and suicidal ideations. ? ?Home Medications ?Prior to Admission medications   ?Medication Sig Start Date End Date Taking? Authorizing Provider  ?acetaminophen (TYLENOL) 500 MG tablet Take 1,000 mg by mouth every 6 (six) hours as needed.   Yes [provider]  ?Beta Carotene (VITAMIN A) 25000 UNIT capsule Take 25,000 Units by mouth daily.   Yes [provider]  ?vitamin B-12 (CYANOCOBALAMIN) 100  MCG tablet Take 100 mcg by mouth daily.   Yes [provider]  ?   ? ?Allergies    ?Sulfa antibiotics   ? ?Review of Systems   ?Review of Systems  ?Constitutional:  Positive for chills and fever.  ?Respiratory:  Positive for cough. Negative for shortness of breath.   ?Cardiovascular:  Negative for chest pain.  ?Gastrointestinal:  Negative for abdominal pain, diarrhea, nausea and vomiting.  ?Musculoskeletal:   ?     Left shoulder and right foot pain  ?Skin:  Positive for rash.  ?Neurological:  Negative for headaches.  ? ?Physical Exam ?Updated Vital Signs ?BP (!) 153/89   Pulse (!) 109   Temp (!) 100.6 ?F (38.1 ?C) (Rectal)   Resp (!) 23   Ht 5\' 3"  (1.6 m)   Wt 72.6 kg   SpO2 92%   BMI 28.34 kg/m?  ?Physical Exam ?Vitals and nursing note reviewed.  ?Constitutional:   ?   General: He is not in acute distress. ?   Appearance: He is not ill-appearing.  ?   Comments: Ill-appearing, disheveled  ?HENT:  ?   Head: Normocephalic and atraumatic.  ?   Comments: No deformity of the head present no raccoon eyes or battle sign noted head is nontender to palpation ?   Nose: No congestion.  ?   Mouth/Throat:  ?   Mouth: Mucous membranes are moist.  ?   Pharynx: Oropharynx is clear.  ?  Comments: No trismus no torticollis no oral trauma present. ?Eyes:  ?   Extraocular Movements: Extraocular movements intact.  ?   Conjunctiva/sclera: Conjunctivae normal.  ?   Pupils: Pupils are equal, round, and reactive to light.  ?Cardiovascular:  ?   Rate and Rhythm: Normal rate and regular rhythm.  ?   Pulses: Normal pulses.  ?   Heart sounds: No murmur heard. ?  No friction rub. No gallop.  ?Pulmonary:  ?   Effort: No respiratory distress.  ?   Breath sounds: Rhonchi present. No wheezing or rales.  ?   Comments: No evidence of respiratory distress he is nontachypneic nonhypoxic but O2 sats are in the low 90s, able to speak in full sentences, patient had noted rhonchi worse in the right lower lobe versus the left, slight  expiratory wheeze heard right lung no stridor present. ?Abdominal:  ?   Palpations: Abdomen is soft.  ?   Tenderness: There is no abdominal tenderness. There is no right CVA tenderness or left CVA tenderness.  ?Musculoskeletal:  ?   Comments: Spine was palpated nontender to palpation no step-off deformities noted.  No pelvis instability.  Patient tenderness on the anterior aspect the left humeral head, no tenderness along the left scapula or left clavicle, he has full remission along his left elbow wrist and fingers.  He has full range of motion in the right upper extremity left as well as the lower extremities.  He is tender to patient on his right fourth metatarsal.  Neurovascular fully intact in all 4 extremities.  ?Skin: ?   General: Skin is warm and dry.  ?   Comments: Patient has erythematous rash on his right shoulder does not cross the midline vesicles presents blanches with pressure slightly warm to the touch nontender to palpation no fluctuant induration noted.  No scaling of the skin.  ?Neurological:  ?   Mental Status: He is alert.  ?   Comments: No facial asymmetry no difficulty with word finding , following two-step commands no unilateral weakness present.  ?Psychiatric:     ?   Mood and Affect: Mood normal.  ?   Comments: Patient is responding appropriately does not appear responding to internal stimuli denies suicidal homicidal ideations.  Does not appear to be tremulous on my exam  ? ? ?ED Results / Procedures / Treatments   ?Labs ?(all labs ordered are listed, but only abnormal results are displayed) ?Labs Reviewed  ?LACTIC ACID, PLASMA - Abnormal; Notable for the following components:  ?    Result Value  ? Lactic Acid, Venous 2.7 (*)   ? All other components within normal limits  ?LACTIC ACID, PLASMA - Abnormal; Notable for the following components:  ? Lactic Acid, Venous 2.2 (*)   ? All other components within normal limits  ?COMPREHENSIVE METABOLIC PANEL - Abnormal; Notable for the following  components:  ? Sodium 133 (*)   ? Chloride 96 (*)   ? Glucose, Bld 112 (*)   ? AST 309 (*)   ? ALT 103 (*)   ? All other components within normal limits  ?CBC WITH DIFFERENTIAL/PLATELET - Abnormal; Notable for the following components:  ? WBC 18.0 (*)   ? Neutro Abs 15.0 (*)   ? Monocytes Absolute 1.2 (*)   ? Abs Immature Granulocytes 0.11 (*)   ? All other components within normal limits  ?URINALYSIS, ROUTINE W REFLEX MICROSCOPIC - Abnormal; Notable for the following components:  ? Glucose, UA 150 (*)   ?  Hgb urine dipstick MODERATE (*)   ? Protein, ur 100 (*)   ? All other components within normal limits  ?RAPID URINE DRUG SCREEN, HOSP PERFORMED - Abnormal; Notable for the following components:  ? Amphetamines POSITIVE (*)   ? Tetrahydrocannabinol POSITIVE (*)   ? All other components within normal limits  ?BLOOD GAS, VENOUS - Abnormal; Notable for the following components:  ? pCO2, Ven 43 (*)   ? All other components within normal limits  ?RESP PANEL BY RT-PCR (FLU A&B, COVID) ARPGX2  ?CULTURE, BLOOD (ROUTINE X 2)  ?CULTURE, BLOOD (ROUTINE X 2)  ?ETHANOL  ?HEPATITIS PANEL, ACUTE  ? ? ?EKG ?None ? ?Radiology ?CT Head Wo Contrast ? ?Result Date: 04/26/2021 ?CLINICAL DATA:  Head trauma, intoxicated last night EXAM: CT HEAD WITHOUT CONTRAST CT CERVICAL SPINE WITHOUT CONTRAST TECHNIQUE: Multidetector CT imaging of the head and cervical spine was performed following the standard protocol without intravenous contrast. Multiplanar CT image reconstructions of the cervical spine were also generated. RADIATION DOSE REDUCTION: This exam was performed according to the departmental dose-optimization program which includes automated exposure control, adjustment of the mA and/or kV according to patient size and/or use of iterative reconstruction technique. COMPARISON:  None. FINDINGS: CT HEAD FINDINGS Brain: No evidence of acute infarction, hemorrhage, hydrocephalus, extra-axial collection or mass lesion/mass effect. Vascular: No  hyperdense vessel or unexpected calcification. Skull: Normal. Negative for fracture or focal lesion. Sinuses/Orbits: No acute finding. Other: None. CT CERVICAL SPINE FINDINGS Alignment: Normal. Skull base and verteb

## 2021-04-26 NOTE — ED Notes (Signed)
Pt c/o left foot pain , like something is stuck in it.  ?

## 2021-04-26 NOTE — Assessment & Plan Note (Addendum)
Smokes 2 packs per day ?Nicotine patch ordered ?Advised importance of cessation ?

## 2021-04-27 ENCOUNTER — Observation Stay (HOSPITAL_COMMUNITY): Payer: Self-pay

## 2021-04-27 DIAGNOSIS — S92345A Nondisplaced fracture of fourth metatarsal bone, left foot, initial encounter for closed fracture: Secondary | ICD-10-CM

## 2021-04-27 DIAGNOSIS — F1010 Alcohol abuse, uncomplicated: Secondary | ICD-10-CM

## 2021-04-27 DIAGNOSIS — B029 Zoster without complications: Secondary | ICD-10-CM

## 2021-04-27 DIAGNOSIS — R6520 Severe sepsis without septic shock: Secondary | ICD-10-CM

## 2021-04-27 DIAGNOSIS — A419 Sepsis, unspecified organism: Secondary | ICD-10-CM

## 2021-04-27 DIAGNOSIS — R55 Syncope and collapse: Secondary | ICD-10-CM

## 2021-04-27 DIAGNOSIS — F1510 Other stimulant abuse, uncomplicated: Secondary | ICD-10-CM

## 2021-04-27 DIAGNOSIS — J690 Pneumonitis due to inhalation of food and vomit: Secondary | ICD-10-CM

## 2021-04-27 DIAGNOSIS — F10221 Alcohol dependence with intoxication delirium: Secondary | ICD-10-CM

## 2021-04-27 DIAGNOSIS — J181 Lobar pneumonia, unspecified organism: Secondary | ICD-10-CM

## 2021-04-27 DIAGNOSIS — F1110 Opioid abuse, uncomplicated: Secondary | ICD-10-CM

## 2021-04-27 LAB — BLOOD CULTURE ID PANEL (REFLEXED) - BCID2

## 2021-04-27 LAB — CBC WITH DIFFERENTIAL/PLATELET
Abs Immature Granulocytes: 0.13 10*3/uL — ABNORMAL HIGH (ref 0.00–0.07)
Basophils Absolute: 0.1 10*3/uL (ref 0.0–0.1)
Basophils Relative: 1 %
Eosinophils Absolute: 0.8 10*3/uL — ABNORMAL HIGH (ref 0.0–0.5)
Eosinophils Relative: 4 %
HCT: 36.5 % — ABNORMAL LOW (ref 39.0–52.0)
Hemoglobin: 12 g/dL — ABNORMAL LOW (ref 13.0–17.0)
Immature Granulocytes: 1 %
Lymphocytes Relative: 10 %
Lymphs Abs: 1.8 10*3/uL (ref 0.7–4.0)
MCH: 31.6 pg (ref 26.0–34.0)
MCHC: 32.9 g/dL (ref 30.0–36.0)
MCV: 96.1 fL (ref 80.0–100.0)
Monocytes Absolute: 1.2 10*3/uL — ABNORMAL HIGH (ref 0.1–1.0)
Monocytes Relative: 7 %
Neutro Abs: 13.9 10*3/uL — ABNORMAL HIGH (ref 1.7–7.7)
Neutrophils Relative %: 77 %
Platelets: 272 10*3/uL (ref 150–400)
RBC: 3.8 MIL/uL — ABNORMAL LOW (ref 4.22–5.81)
RDW: 12.8 % (ref 11.5–15.5)
WBC: 17.9 10*3/uL — ABNORMAL HIGH (ref 4.0–10.5)
nRBC: 0 % (ref 0.0–0.2)

## 2021-04-27 LAB — COMPREHENSIVE METABOLIC PANEL
ALT: 79 U/L — ABNORMAL HIGH (ref 0–44)
AST: 210 U/L — ABNORMAL HIGH (ref 15–41)
Albumin: 2.5 g/dL — ABNORMAL LOW (ref 3.5–5.0)
Alkaline Phosphatase: 76 U/L (ref 38–126)
Anion gap: 5 (ref 5–15)
BUN: 11 mg/dL (ref 6–20)
CO2: 21 mmol/L — ABNORMAL LOW (ref 22–32)
Calcium: 7.8 mg/dL — ABNORMAL LOW (ref 8.9–10.3)
Chloride: 110 mmol/L (ref 98–111)
Creatinine, Ser: 0.81 mg/dL (ref 0.61–1.24)
GFR, Estimated: >60 mL/min (ref >60)
Glucose, Bld: 101 mg/dL — ABNORMAL HIGH (ref 70–99)
Potassium: 3.9 mmol/L (ref 3.5–5.1)
Sodium: 136 mmol/L (ref 135–145)
Total Bilirubin: 0.8 mg/dL (ref 0.3–1.2)
Total Protein: 5.8 g/dL — ABNORMAL LOW (ref 6.5–8.1)

## 2021-04-27 LAB — EXPECTORATED SPUTUM ASSESSMENT W GRAM STAIN, RFLX TO RESP C

## 2021-04-27 LAB — VITAMIN B12: Vitamin B-12: 238 pg/mL (ref 180–914)

## 2021-04-27 LAB — PROCALCITONIN: Procalcitonin: 3.51 ng/mL

## 2021-04-27 LAB — HIV ANTIBODY (ROUTINE TESTING W REFLEX): HIV Screen 4th Generation wRfx: NONREACTIVE

## 2021-04-27 LAB — PROTIME-INR
INR: 1.1 (ref 0.8–1.2)
Prothrombin Time: 14.1 seconds (ref 11.4–15.2)

## 2021-04-27 LAB — STREP PNEUMONIAE URINARY ANTIGEN: Strep Pneumo Urinary Antigen: NEGATIVE

## 2021-04-27 LAB — LACTIC ACID, PLASMA: Lactic Acid, Venous: 0.9 mmol/L (ref 0.5–1.9)

## 2021-04-27 LAB — MAGNESIUM: Magnesium: 2.3 mg/dL (ref 1.7–2.4)

## 2021-04-27 LAB — CORTISOL-AM, BLOOD: Cortisol - AM: 23.1 ug/dL — ABNORMAL HIGH (ref 6.7–22.6)

## 2021-04-27 MED ORDER — PIPERACILLIN-TAZOBACTAM 3.375 G IVPB
3.3750 g | Freq: Three times a day (TID) | INTRAVENOUS | Status: DC
  Administered 2021-04-27: 3.375 g via INTRAVENOUS
  Filled 2021-04-27: qty 50

## 2021-04-27 MED ORDER — GUAIFENESIN-DM 100-10 MG/5ML PO SYRP
5.0000 mL | ORAL_SOLUTION | ORAL | Status: DC | PRN
  Administered 2021-04-27: 5 mL via ORAL
  Filled 2021-04-27: qty 5

## 2021-04-27 NOTE — Discharge Summary (Signed)
?Physician Discharge Summary ?  ?Patient: Barry Randolph MRN: [id number] DOB: 1990-08-08  ?Admit date:     04/26/2021  ?Discharge date: 04/27/21  ?Discharge Physician: Shanon Brow Ellouise Mcwhirter  ? ?PCP: Pcp, No  ? ?PATIENT LEFT AGAINST MEDICAL ADVICE ?In speaking with [name], Barry Randolph has demonstrated the ability to understand his medical condition(s) which include pneumonia, sepsis, death.  Barry Randolph has demonstrated the ability to appreciate how treatment for pneumonia, sepsis, death will be beneficial.   Barry Randolph has also demonstrated the ability to understand and appreciate how refusal of treatment for pneumonia sepsis death could result in harm, repeat hospitalization, and possibly death.  Barry Randolph demonstrates the ability to reason through the risks and benefits of the proposed treatment.  Finally, [name] is able to clearly communicate his/her choice. ? ? ? ?Hospital Course: ?31 year old male with a history of polysubstance abuse including fentanyl, methamphetamine, alcohol, tobacco, and prior IV heroin use presenting EMS from home.  His initial complaint was left shoulder pain and left foot pain.  Apparently, he told his mother to call the ambulance because of his pain.  The patient does not recall the events of the prior night before he came to the emergency department.  Unfortunately, his mother with whom he lives is a difficult historian at best.  She states that he was with his friends in the house " drinking and probably using drugs".  Out of frustration, his mother essentially left him alone with his friends after an argument.  Apparently, the patient was sitting in a recliner when he was yelling for his mother to call EMS because of his pain.  She did not note any blood or vomitus around him. ?The patient states that he drank eight 20 ounce beers and snorted some fentanyl and that was the last thing he remembered.  He states that he used methamphetamine about 1 week ago.  He  usually snorts fentanyl " a couple times a week" and he drinks alcohol on daily basis.  The patient denies biting his tongue or any bowel or bladder incontinence.  At the time of arrival, the patient had been complaining of some shortness of breath and a cough with yellow-green sputum.  He denied any nausea, vomiting, diarrhea, abdominal pain, dysuria, hematuria. ?In the ED, the patient was febrile to 1 1.0 ?F.  He was tachycardic in the 120s.  Oxygen saturation was 90% on room air.  BMP showed sodium 133, potassium 3.8, bicarbonate 27, serum creatinine 0.95.  AST 39, ALT 103, alk phosphatase 55, total bilirubin 0.5.  WBC 18.0, hemoglobin 15.4, platelets 323.  CTA chest was negative for PE but showed multifocal patchy and nodular opacities in the bilateral lungs, most confluent in LLL.  X-ray of the left shoulder was negative for any osseous abnormalities.  X-ray of the left foot showed nondisplaced fracture of the fourth metatarsal head.  Lactic acid 2.7>> 0.9.  The patient was given IV fluids and started on ceftriaxone and azithromycin. ? ?Assessment and Plan: ?* Severe sepsis ([place]) ?Presented with fever, tachycardia, leukocytosis ?Lactic acid 2.7>> 0.9 ?Secondary to aspiration pneumonia ?Continue IV fluids ?Follow blood cultures ?Start Zosyn ?Continue azithromycin ?PCT 3.51 ? ?Lobar pneumonia ([place]) ?04/26/2021 CTA chest--negative PE; multifocal patchy nodular opacities bilateral lungs, most confluent LLL ?Continue Zosyn and azithromycin ?PCT 3.51 ? ?Aspiration pneumonitis ([place]) ?Continue antibiotics as discussed above ? ?Syncope and collapse ?Secondary to drug/alcohol OD ?No SI/HI ?Hx less suggestive of seizure ?EEG ? ?  Closed nondisplaced fracture of fourth metatarsal bone of left foot ?Conservative care ?Orthopedic shoe ? ?Tobacco use disorder ?Smokes 2 packs per day ?Nicotine patch ordered ?Advised importance of cessation ? ?Transaminitis ?Likely multifactorial with alcohol use and sepsis both  contributing ?Viral hepatitis panel negative ?Hold hepatotoxic agents when possible ?RUQ [place] ? ?Shingles ?Vesicular rash on left shoulder ?Reports chicken pox in his youth ?Valtrex started in ED - continue ?Gabapentin added for pain control ? ?Alcohol dependence ([place]) ?Alcohol withdrawal protocol ? ? ? ? ?DISCHARGE MEDICATION: ?Allergies as of 04/27/2021   ? ?   Reactions  ? Sulfa Antibiotics Hives  ? ?  ? ?  ?Medication List  ?  ? ?ASK your doctor about these medications   ? ?acetaminophen 500 MG tablet ?Commonly known as: TYLENOL ?Take 1,000 mg by mouth every 6 (six) hours as needed. ?  ?vitamin A 25000 UNIT capsule ?Take 25,000 Units by mouth daily. ?  ?vitamin B-12 100 MCG tablet ?Commonly known as: CYANOCOBALAMIN ?Take 100 mcg by mouth daily. ?  ? ?  ? ? ?Discharge Exam: ?Filed Weights  ? 04/26/21 1019 04/26/21 1950 04/27/21 0500  ?Weight: 72.6 kg 75.2 kg 75.2 kg  ? ?HEENT:  [place]/AT, No thrush, no icterus ?CV:  RRR, no rub, no S3, no S4 ?Lung:  bilateral rhonchi ?Abd:  soft/+BS, NT ?Ext:  No edema, no lymphangitis, no synovitis, no rash ? ? ?Condition at discharge:  AMA ? ?The results of significant diagnostics from this hospitalization (including imaging, microbiology, ancillary and laboratory) are listed below for reference.  ? ?Imaging Studies: ?CT Head Wo Contrast ? ?Result Date: 04/26/2021 ?CLINICAL DATA:  Head trauma, intoxicated last night EXAM: CT HEAD WITHOUT CONTRAST CT CERVICAL SPINE WITHOUT CONTRAST TECHNIQUE: Multidetector CT imaging of the head and cervical spine was performed following the standard protocol without intravenous contrast. Multiplanar CT image reconstructions of the cervical spine were also generated. RADIATION DOSE REDUCTION: This exam was performed according to the departmental dose-optimization program which includes automated exposure control, adjustment of the mA and/or kV according to patient size and/or use of iterative reconstruction technique. COMPARISON:  None. FINDINGS: CT  HEAD FINDINGS Brain: No evidence of acute infarction, hemorrhage, hydrocephalus, extra-axial collection or mass lesion/mass effect. Vascular: No hyperdense vessel or unexpected calcification. Skull: Normal. Negative for fracture or focal lesion. Sinuses/Orbits: No acute finding. Other: None. CT CERVICAL SPINE FINDINGS Alignment: Normal. Skull base and vertebrae: No acute fracture. No primary bone lesion or focal pathologic process. Soft tissues and spinal canal: No prevertebral fluid or swelling. No visible canal hematoma. Disc levels:  Intact. Upper chest: Negative. Other: None. IMPRESSION: 1. No acute intracranial pathology. 2. No fracture or static subluxation of the cervical spine. Electronically Signed   By: Delanna Ahmadi M.D.   On: 04/26/2021 14:59  ? ?CT Angio Chest PE W and/or Wo Contrast ? ?Result Date: 04/26/2021 ?CLINICAL DATA:  Pulmonary embolism (PE) suspected, high prob EXAM: CT ANGIOGRAPHY CHEST WITH CONTRAST TECHNIQUE: Multidetector CT imaging of the chest was performed using the standard protocol during bolus administration of intravenous contrast. Multiplanar CT image reconstructions and MIPs were obtained to evaluate the vascular anatomy. RADIATION DOSE REDUCTION: This exam was performed according to the departmental dose-optimization program which includes automated exposure control, adjustment of the mA and/or kV according to patient size and/or use of iterative reconstruction technique. CONTRAST:  53m OMNIPAQUE IOHEXOL 350 MG/ML SOLN COMPARISON:  Same day chest x-ray FINDINGS: Cardiovascular: Satisfactory opacification of the pulmonary arteries to the segmental level. No  evidence of pulmonary embolism. Evaluation slightly degraded by respiratory motion artifact. Thoracic aorta is normal in course and caliber. Normal heart size. No pericardial effusion. Mediastinum/Nodes: Multiple mildly prominent mediastinal and bilateral hilar lymph nodes. No axillary lymphadenopathy. Thyroid, trachea, and  esophagus within normal limits. Lungs/Pleura: Multifocal patchy and nodular airspace opacities throughout both lungs, most confluent within the medial aspect of the left lower lobe. Many of the opacities are mixed [place]

## 2021-04-27 NOTE — Assessment & Plan Note (Signed)
Continue antibiotics as discussed above ?

## 2021-04-27 NOTE — Plan of Care (Signed)
Airborne + Contact isolation initiated per orders for suspected shinlgles. Patient remains on IV antibiotics. ? ? ?Problem: Education: ?Goal: Knowledge of General Education information will improve ?Description: Including pain rating scale, medication(s)/side effects and non-pharmacologic comfort measures ?Outcome: Progressing ?  ?Problem: Health Behavior/Discharge Planning: ?Goal: Ability to manage health-related needs will improve ?Outcome: Progressing ?  ?Problem: Clinical Measurements: ?Goal: Ability to maintain clinical measurements within normal limits will improve ?Outcome: Progressing ?Goal: Will remain free from infection ?Outcome: Progressing ?Goal: Diagnostic test results will improve ?Outcome: Progressing ?Goal: Respiratory complications will improve ?Outcome: Progressing ?Goal: Cardiovascular complication will be avoided ?Outcome: Progressing ?  ?Problem: Activity: ?Goal: Risk for activity intolerance will decrease ?Outcome: Progressing ?  ?Problem: Nutrition: ?Goal: Adequate nutrition will be maintained ?Outcome: Progressing ?  ?Problem: Coping: ?Goal: Level of anxiety will decrease ?Outcome: Progressing ?  ?Problem: Elimination: ?Goal: Will not experience complications related to bowel motility ?Outcome: Progressing ?Goal: Will not experience complications related to urinary retention ?Outcome: Progressing ?  ?Problem: Pain Managment: ?Goal: General experience of comfort will improve ?Outcome: Progressing ?  ?Problem: Safety: ?Goal: Ability to remain free from injury will improve ?Outcome: Progressing ?  ?Problem: Skin Integrity: ?Goal: Risk for impaired skin integrity will decrease ?Outcome: Progressing ?  ?

## 2021-04-27 NOTE — Plan of Care (Addendum)
Patient signed out AMA. ? ?Patient left the unit @ 1235 hrs under his own power and in no obvious distress. ? ?Problem: Education: ?Goal: Knowledge of General Education information will improve ?Description: Including pain rating scale, medication(s)/side effects and non-pharmacologic comfort measures ?04/27/2021 1148 by Jesse Sans, RN ?Outcome: Completed/Met ?04/27/2021 1011 by Jesse Sans, RN ?Outcome: Progressing ?  ?Problem: Health Behavior/Discharge Planning: ?Goal: Ability to manage health-related needs will improve ?04/27/2021 1148 by Jesse Sans, RN ?Outcome: Completed/Met ?04/27/2021 1011 by Jesse Sans, RN ?Outcome: Progressing ?  ?Problem: Clinical Measurements: ?Goal: Ability to maintain clinical measurements within normal limits will improve ?04/27/2021 1148 by Jesse Sans, RN ?Outcome: Completed/Met ?04/27/2021 1011 by Jesse Sans, RN ?Outcome: Progressing ?Goal: Will remain free from infection ?04/27/2021 1148 by Jesse Sans, RN ?Outcome: Completed/Met ?04/27/2021 1011 by Jesse Sans, RN ?Outcome: Progressing ?Goal: Diagnostic test results will improve ?04/27/2021 1148 by Jesse Sans, RN ?Outcome: Completed/Met ?04/27/2021 1011 by Jesse Sans, RN ?Outcome: Progressing ?Goal: Respiratory complications will improve ?04/27/2021 1148 by Jesse Sans, RN ?Outcome: Completed/Met ?04/27/2021 1011 by Jesse Sans, RN ?Outcome: Progressing ?Goal: Cardiovascular complication will be avoided ?04/27/2021 1148 by Jesse Sans, RN ?Outcome: Completed/Met ?04/27/2021 1011 by Jesse Sans, RN ?Outcome: Progressing ?  ?Problem: Activity: ?Goal: Risk for activity intolerance will decrease ?04/27/2021 1148 by Jesse Sans, RN ?Outcome: Completed/Met ?04/27/2021 1011 by Jesse Sans, RN ?Outcome: Progressing ?  ?Problem: Nutrition: ?Goal: Adequate nutrition will be maintained ?04/27/2021 1148 by Jesse Sans, RN ?Outcome: Completed/Met ?04/27/2021 1011 by Jesse Sans,  RN ?Outcome: Progressing ?  ?Problem: Coping: ?Goal: Level of anxiety will decrease ?04/27/2021 1148 by Jesse Sans, RN ?Outcome: Completed/Met ?04/27/2021 1011 by Jesse Sans, RN ?Outcome: Progressing ?  ?Problem: Elimination: ?Goal: Will not experience complications related to bowel motility ?04/27/2021 1148 by Jesse Sans, RN ?Outcome: Completed/Met ?04/27/2021 1011 by Jesse Sans, RN ?Outcome: Progressing ?Goal: Will not experience complications related to urinary retention ?04/27/2021 1148 by Jesse Sans, RN ?Outcome: Completed/Met ?04/27/2021 1011 by Jesse Sans, RN ?Outcome: Progressing ?  ?Problem: Pain Managment: ?Goal: General experience of comfort will improve ?04/27/2021 1148 by Jesse Sans, RN ?Outcome: Completed/Met ?04/27/2021 1011 by Jesse Sans, RN ?Outcome: Progressing ?  ?Problem: Safety: ?Goal: Ability to remain free from injury will improve ?04/27/2021 1148 by Jesse Sans, RN ?Outcome: Completed/Met ?04/27/2021 1011 by Jesse Sans, RN ?Outcome: Progressing ?  ?Problem: Skin Integrity: ?Goal: Risk for impaired skin integrity will decrease ?04/27/2021 1148 by Jesse Sans, RN ?Outcome: Completed/Met ?04/27/2021 1011 by Jesse Sans, RN ?Outcome: Progressing ?  ?

## 2021-04-27 NOTE — TOC Initial Note (Signed)
Transition of Care (TOC) - Initial/Assessment Note  ? ? ?Patient Details  ?Name: Barry Randolph ?MRN: [id number] ?Date of Birth: 11/06/1990 ? ?Transition of Care (TOC) CM/SW Contact:    ?Leitha Bleak, RN ?Phone Number: ?04/27/2021, 12:02 PM ? ?Clinical Narrative:        TOC consulted for substance abuse. Patient on precautions, not answering his phone, TOC called his mother to discuss Substance abuse resource packet. Provided some new options for out patient rehab with [facility]. Packet taken to unit to be given with discharge papers.           ? ? ?Expected Discharge Plan: Home/Self Care ?Barriers to Discharge: Continued Medical Work up ? ? ?Patient Goals and CMS Choice ?Patient states their goals for this hospitalization and ongoing recovery are:: to go home. ?CMS Medicare.gov Compare Post Acute Care list provided to:: Patient Represenative (must comment) ?Choice offered to / list presented to : Parent ? ?Expected Discharge Plan and Services ?Expected Discharge Plan: Home/Self Care ?  ? Prior Living Arrangements/Services ?  ?Lives with:: Parents ?  ?Activities of Daily Living ?Home Assistive Devices/Equipment: None ?ADL Screening (condition at time of admission) ?Patient's cognitive ability adequate to safely complete daily activities?: Yes ?Is the patient deaf or have difficulty hearing?: No ?Does the patient have difficulty seeing, even when wearing glasses/contacts?: No ?Does the patient have difficulty concentrating, remembering, or making decisions?: No ?Patient able to express need for assistance with ADLs?: Yes ?Does the patient have difficulty dressing or bathing?: No ?Independently performs ADLs?: Yes (appropriate for developmental age) ?Does the patient have difficulty walking or climbing stairs?: No ?Weakness of Legs: None ?Weakness of Arms/Hands: None ? ?Permission Sought/Granted ?   ? ?Emotional Assessment ?  ?   ?Admission diagnosis:  Herpes zoster without complication [B02.9] ?Closed nondisplaced  fracture of fourth metatarsal bone of left foot, initial encounter [S92.345A] ?Sepsis (HCC) [A41.9] ?Alcohol withdrawal syndrome without complication (HCC) [F10.930] ?Sepsis due to pneumonia (HCC) [J18.9, A41.9] ?Community acquired pneumonia of left lower lobe of lung [J18.9] ?Sepsis without acute organ dysfunction, due to unspecified organism [facility]) [A41.9] ?Patient Active Problem List  ? Diagnosis Date Noted  ? Lobar pneumonia (HCC) 04/27/2021  ? Aspiration pneumonitis (HCC) 04/27/2021  ? Alcohol abuse 04/27/2021  ? Methamphetamine abuse (HCC) 04/27/2021  ? Opioid abuse (HCC) 04/27/2021  ? Syncope and collapse 04/27/2021  ? Closed nondisplaced fracture of fourth metatarsal bone of left foot 04/27/2021  ? Alcohol dependence (HCC) 04/26/2021  ? Shingles 04/26/2021  ? Transaminitis 04/26/2021  ? Tobacco use disorder 04/26/2021  ? Severe sepsis (HCC) 04/26/2021  ? ?PCP:  Pcp, No ?Pharmacy:   ?[id number] - [place], [place] - [address] ?[address] ?[place] [address] ?Phone: [phone] Fax: [phone] ? ? ?Readmission Risk Interventions ?   ? View : No data to display.  ?  ?  ?  ? ? ? ?

## 2021-04-27 NOTE — Assessment & Plan Note (Addendum)
Presented with fever, tachycardia, leukocytosis ?Lactic acid 2.7>> 0.9 ?Secondary to aspiration pneumonia ?Continue IV fluids ?Follow blood cultures ?Start Zosyn ?Continue azithromycin ?PCT 3.51 ?

## 2021-04-27 NOTE — Progress Notes (Signed)
Patient expressed desire to leave AMA. Dr. Carles Collet paged to bedside. Patient reaffirmed desire to leave AMA. Patient signed AMA paper at 1140 hrs and witnessed by this RN. PIV removed by this RN. ?

## 2021-04-27 NOTE — Hospital Course (Signed)
30-year-old male with a history of polysubstance abuse including fentanyl, methamphetamine, alcohol, tobacco, and prior IV heroin use presenting EMS from home.  His initial complaint was left shoulder pain and left foot pain.  Apparently, he told his mother to call the ambulance because of his pain.  The patient does not recall the events of the prior night before he came to the emergency department.  Unfortunately, his mother with whom he lives is a difficult historian at best.  She states that he was with his friends in the house " drinking and probably using drugs".  Out of frustration, his mother essentially left him alone with his friends after an argument.  Apparently, the patient was sitting in a recliner when he was yelling for his mother to call EMS because of his pain.  She did not note any blood or vomitus around him. ?The patient states that he drank eight 20 ounce beers and snorted some fentanyl and that was the last thing he remembered.  He states that he used methamphetamine about 1 week ago.  He usually snorts fentanyl " a couple times a week" and he drinks alcohol on daily basis.  The patient denies biting his tongue or any bowel or bladder incontinence.  At the time of arrival, the patient had been complaining of some shortness of breath and a cough with yellow-green sputum.  He denied any nausea, vomiting, diarrhea, abdominal pain, dysuria, hematuria. ?In the ED, the patient was febrile to 1 1.0 ?F.  He was tachycardic in the 120s.  Oxygen saturation was 90% on room air.  BMP showed sodium 133, potassium 3.8, bicarbonate 27, serum creatinine 0.95.  AST 39, ALT 103, alk phosphatase 55, total bilirubin 0.5.  WBC 18.0, hemoglobin 15.4, platelets 323.  CTA chest was negative for PE but showed multifocal patchy and nodular opacities in the bilateral lungs, most confluent in LLL.  X-ray of the left shoulder was negative for any osseous abnormalities.  X-ray of the left foot showed nondisplaced fracture  of the fourth metatarsal head.  Lactic acid 2.7>> 0.9.  The patient was given IV fluids and started on ceftriaxone and azithromycin. ?

## 2021-04-27 NOTE — Assessment & Plan Note (Signed)
04/26/2021 CTA chest--negative PE; multifocal patchy nodular opacities bilateral lungs, most confluent LLL ?Continue Zosyn and azithromycin ?PCT 3.51 ?

## 2021-04-27 NOTE — Assessment & Plan Note (Signed)
Conservative care ?Orthopedic shoe ?

## 2021-04-27 NOTE — Assessment & Plan Note (Signed)
Secondary to drug/alcohol OD ?No SI/HI ?Hx less suggestive of seizure ?EEG ?

## 2021-04-27 NOTE — Assessment & Plan Note (Signed)
Alcohol withdrawal protocol. 

## 2021-04-28 LAB — CULTURE, BLOOD (ROUTINE X 2): Special Requests: ADEQUATE

## 2021-04-28 LAB — LEGIONELLA PNEUMOPHILA SEROGP 1 UR AG: L. pneumophila Serogp 1 Ur Ag: NEGATIVE

## 2021-04-29 LAB — CULTURE, RESPIRATORY W GRAM STAIN: Culture: NORMAL

## 2021-05-01 LAB — CULTURE, BLOOD (ROUTINE X 2)
Culture: NO GROWTH
Special Requests: ADEQUATE

## 2023-09-29 IMAGING — CT CT ANGIO CHEST
2 of 7 series · 16 of 36 positions shown · IV contrast (Omnipaque or Isovue)
Comparison: Same day chest x-ray

CLINICAL DATA: Pulmonary embolism (PE) suspected, high prob

EXAM:
CT ANGIOGRAPHY CHEST WITH CONTRAST
TECHNIQUE: Multidetector CT imaging of the chest was performed using the
standard protocol during bolus administration of intravenous
contrast. Multiplanar CT image reconstructions and MIPs were
obtained to evaluate the vascular anatomy.

[Series 5: pe axial thins · axial · 0.70mm/px · z∈[-330,-48]mm · 15 of 405 slices shown]
[im 26/405  lung]
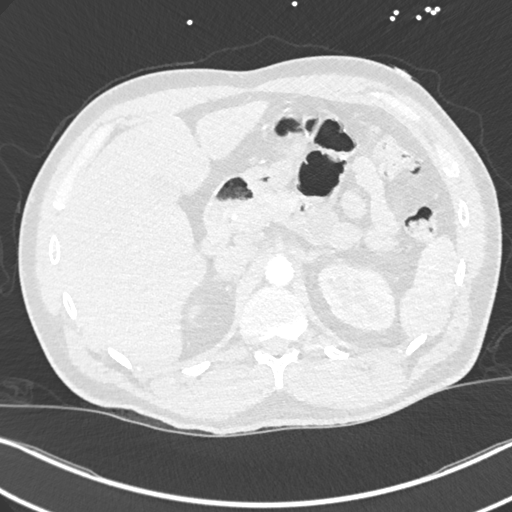
[im 51/405  mediastinal]
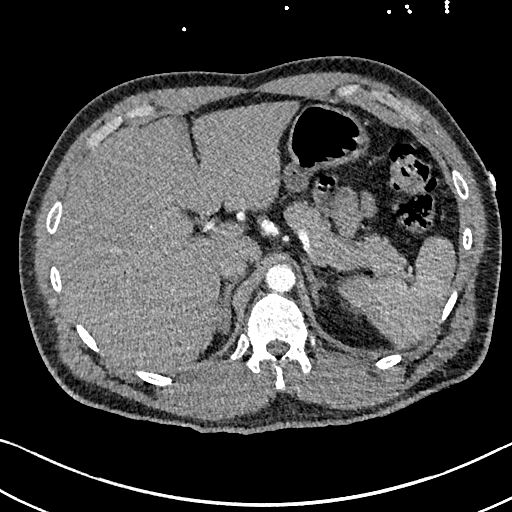
[im 76/405  lung]
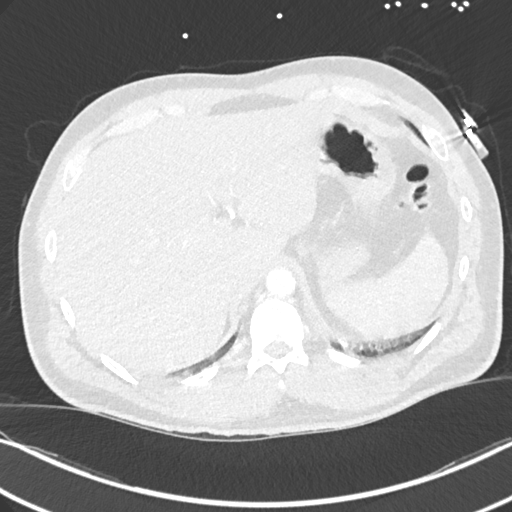
[im 102/405  mediastinal]
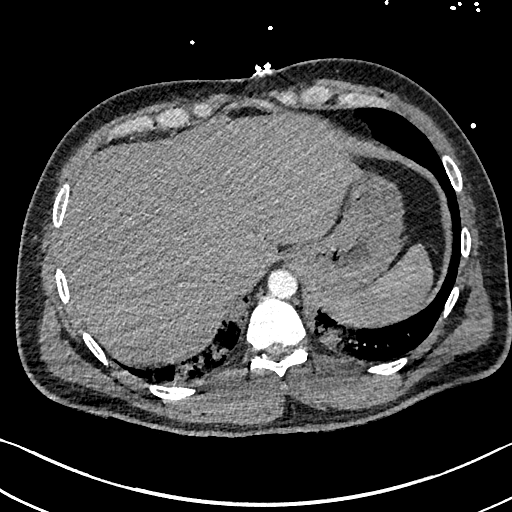
[im 127/405  lung]
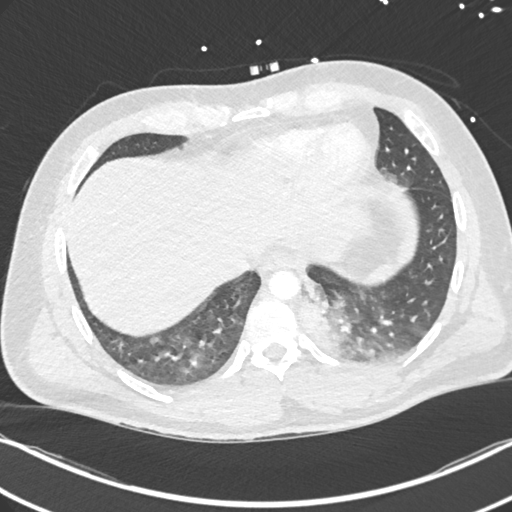
[im 152/405  mediastinal]
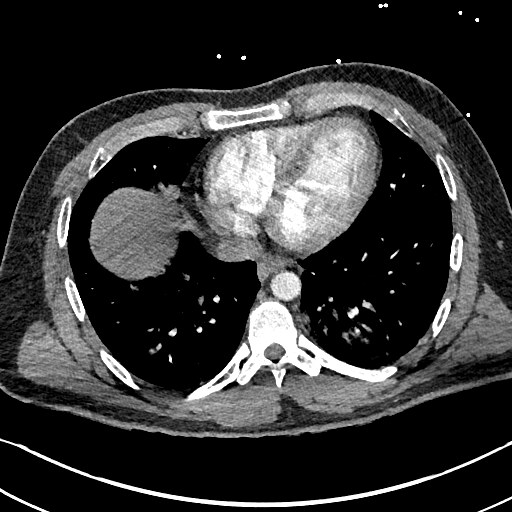
[im 177/405  lung]
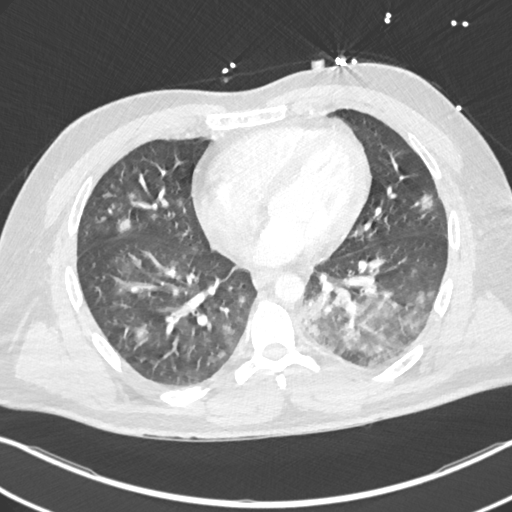
[im 203/405  mediastinal]
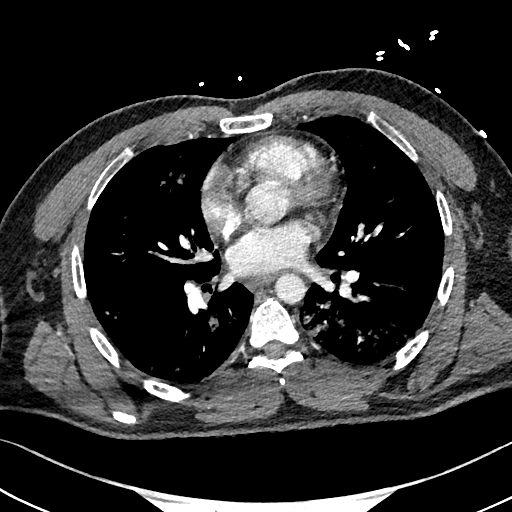
[im 228/405  lung]
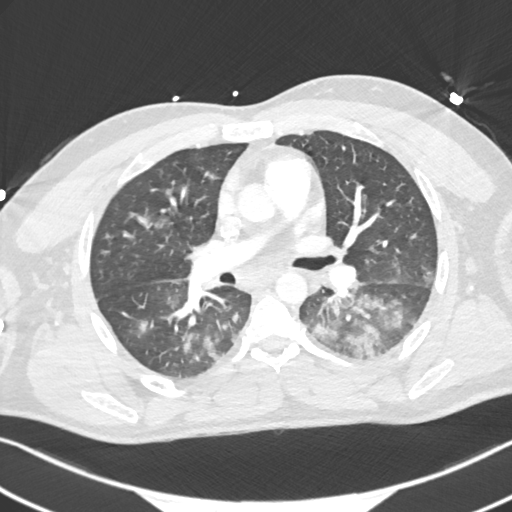
[im 253/405  mediastinal]
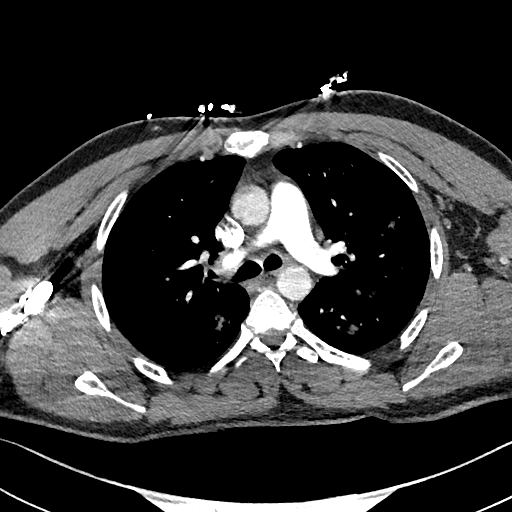
[im 278/405  lung]
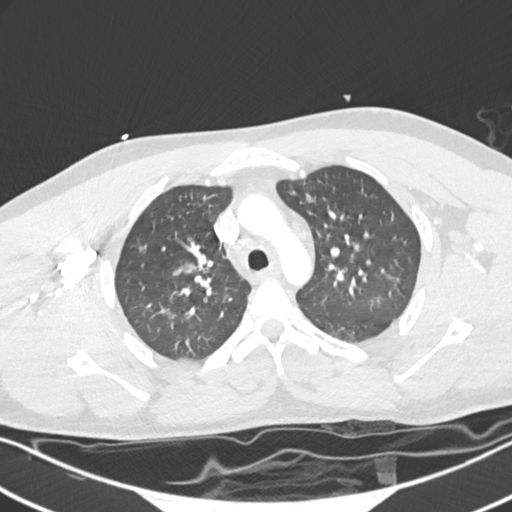
[im 304/405  mediastinal]
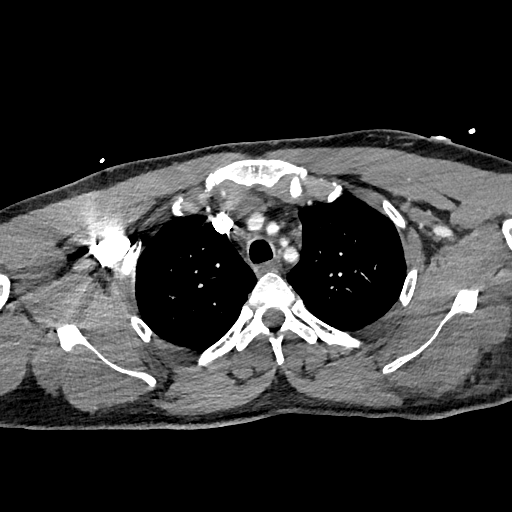
[im 329/405  lung]
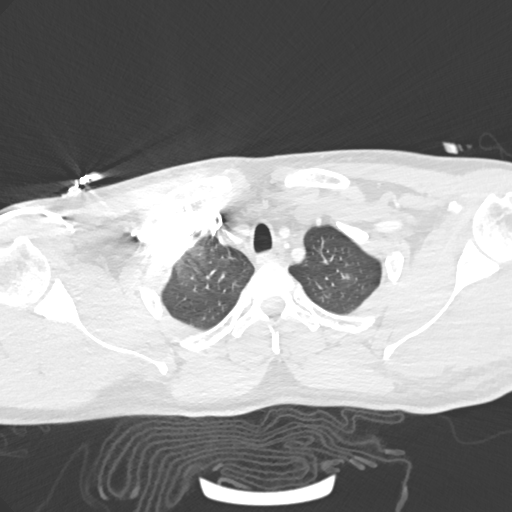
[im 354/405  mediastinal]
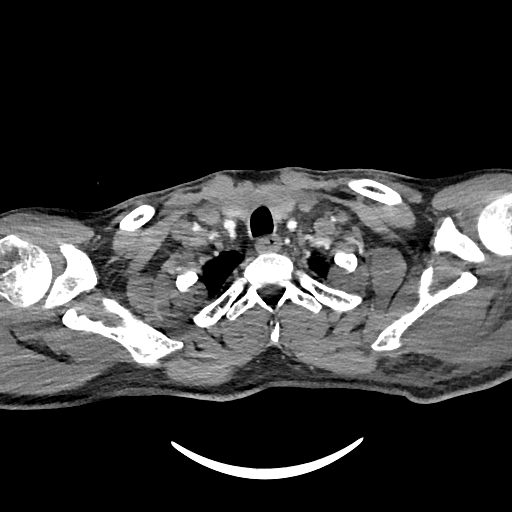
[im 379/405  lung]
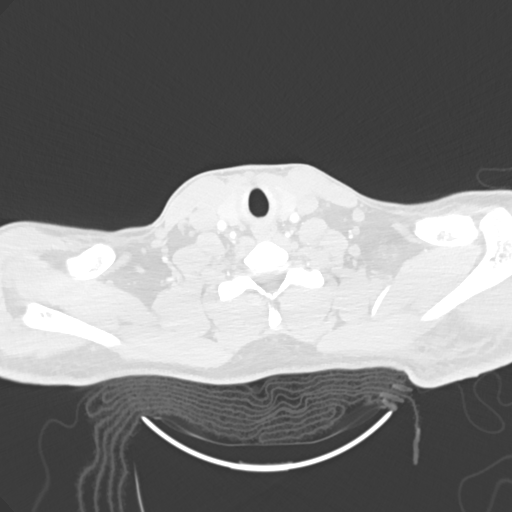

[Series 7: cor soft · coronal · 0.66mm/px · 1 of 133 slices shown]
[im 67/133  mediastinal]
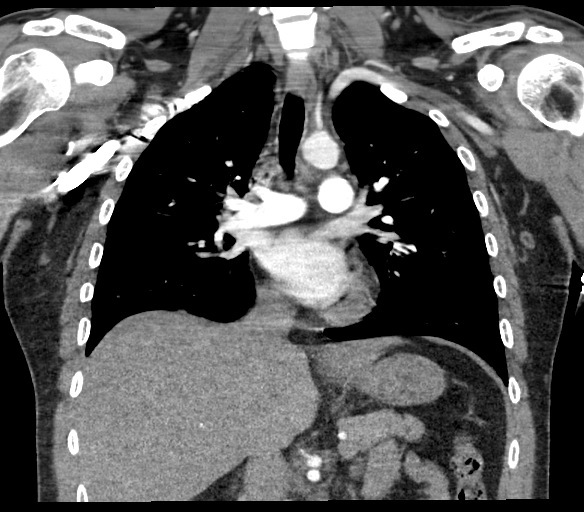

[16 of 36 positions shown; findings below may reference images not displayed]

RADIATION DOSE REDUCTION: This exam was performed according to the
departmental dose-optimization program which includes automated
exposure control, adjustment of the mA and/or kV according to
patient size and/or use of iterative reconstruction technique.

CONTRAST:  75mL OMNIPAQUE IOHEXOL 350 MG/ML SOLN
FINDINGS: Cardiovascular: Satisfactory opacification of the pulmonary arteries
to the segmental level. No evidence of pulmonary embolism.
Evaluation slightly degraded by respiratory motion artifact.
Thoracic aorta is normal in course and caliber. Normal heart size.
No pericardial effusion.

Mediastinum/Nodes: Multiple mildly prominent mediastinal and
bilateral hilar lymph nodes. No axillary lymphadenopathy. Thyroid,
trachea, and esophagus within normal limits.

Lungs/Pleura: Multifocal patchy and nodular airspace opacities
throughout both lungs, most confluent within the medial aspect of
the left lower lobe. Many of the opacities are mixed consolidation
and ground-glass. No definite cavitation. No pleural effusion or
pneumothorax.

Upper Abdomen: No acute abnormality.

Musculoskeletal: No chest wall abnormality. No acute or significant
osseous findings.

Review of the MIP images confirms the above findings.
IMPRESSION: 1. No evidence of pulmonary embolism.
2. Multifocal patchy and nodular airspace opacities throughout both
lungs, most confluent within the left lower lobe. Differential
includes multifocal pneumonia, septic emboli, or possibly a
vasculitis.
3. Multiple mildly prominent mediastinal and bilateral hilar lymph
nodes, likely reactive.

## 2023-10-27 ENCOUNTER — Ambulatory Visit: Payer: Self-pay | Admitting: *Deleted

## 2023-10-27 NOTE — Telephone Encounter (Signed)
 Copied from CRM [id number]. Topic: Clinical - Red Word Triage >> Oct 27, 2023  2:57 PM Antwanette L wrote: Red Word that prompted transfer to Nurse Triage: Pt bp is 182/110 but he has no symptoms. Reason for Disposition  [1] Systolic BP >= 200 OR Diastolic >= 120 AND [2] having NO cardiac or neurologic symptoms    Not established with a PCP.  Referred to ED.  Pt refusing to go.  Answer Assessment - Initial Assessment Questions 1. BLOOD PRESSURE: What is your blood pressure? Did you take at least two measurements 5 minutes apart?     Mother calling in, Delon Minerva but pt there with her answering questions in the background.    BP 182/110 today     He has to see a therapist for substance abuse treatment every 2 weeks and they check his BP.   This is the reading they got.  They told him to go to the hospital but he is refusing to go.   He wants to get established with [facility]   He used to go there years ago.  He is prescribed Clonidine by the doctor at the substance abuse center but he only gives him a short supply.  He needs a doctor to get regular care.  He took one today but it was after he was seen at the therapist.      Denies headaches or dizziness.  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   But he has been clean for a while now.   2. ONSET: When did you take your blood pressure?     At the therapist's office today 3. HOW: How did you take your blood pressure? (e.g., automatic home BP monitor, visiting nurse)     At the therapist's office 4. HISTORY: Do you have a history of high blood pressure?     Yes 5. MEDICINES: Are you taking any medicines for blood pressure? Have you missed any doses recently?     His therapist Dr. Paka has prescribed Clonidine but he needs to establish with a PCP.       6. OTHER SYMPTOMS: Do you have any symptoms? (e.g., blurred vision, chest pain, difficulty breathing, headache, weakness)      Denies symptoms  No headaches or dizziness.    7. PREGNANCY: Is there any chance you are pregnant? When was your last menstrual period?     N/A  Protocols used: Blood Pressure - High-A-AH FYI Only or Action Required?: FYI only for provider.  Patient was last seen in primary care on New pt appt made at [facility] with new provider Oneil Severin, NP for 11/10/2023 at 12:30.     Instructed to go to the ED by substance abuse provider and myself however he is refusing to go.SABRA [facility] Nurse Triage reporting Hypertension. BP 182/110 at substance abuse clinic today Symptoms began today.  Interventions attempted: Prescription medications: Given Clonidine by substance abuse provider to get started   Took one at the clinic.   No way to check his BP at home. .  Symptoms are: Denies headaches or dizziness or other symptoms.   Refusing to go to the ED.  Triage Disposition: See HCP Within 4 Hours (Or PCP Triage) Referred to ED  Patient/caregiver understands and will follow disposition?: No, refuses disposition

## 2023-11-10 ENCOUNTER — Encounter: Payer: Self-pay | Admitting: Family Medicine

## 2023-11-10 ENCOUNTER — Ambulatory Visit: Payer: MEDICAID | Admitting: Family Medicine

## 2023-11-10 VITALS — BP 118/73 | HR 114 | Temp 97.0°F | Ht 63.0 in | Wt 211.6 lb

## 2023-11-10 DIAGNOSIS — I10 Essential (primary) hypertension: Secondary | ICD-10-CM

## 2023-11-10 DIAGNOSIS — Z6837 Body mass index (BMI) 37.0-37.9, adult: Secondary | ICD-10-CM | POA: Diagnosis not present

## 2023-11-10 DIAGNOSIS — F1911 Other psychoactive substance abuse, in remission: Secondary | ICD-10-CM

## 2023-11-10 DIAGNOSIS — F17200 Nicotine dependence, unspecified, uncomplicated: Secondary | ICD-10-CM

## 2023-11-10 NOTE — Progress Notes (Signed)
 New Patient Office Visit  Subjective    Patient ID: Barry Randolph, male    DOB: 1991/01/30  Age: 33 y.o. MRN: [id number]  CC:  Chief Complaint  Patient presents with   Establish Care   Hypertension    HPI Barry Randolph presents to establish care  Patient reports that he has not been to a pcp doctor in the past few years.  Hx of metal rod placed at age 36 from a broke femur.   History of opioid fentanyl use. Went to jail in 23' and was released Jan 24'. Patient states that going to jail is what got him clean.   Reports that he has not used any illegal drugs since Sep 23'.  Goes to [facility] a substance use rehab in [place]. Takes suboxone daily. Patient goes to the rehab every 2 weeks. Is also is also prescribed clonidine and Seroquel by them.  Patient reports that 2 weeks ago his BP was evaluated to 180 systolic at his rehab appointment. Doctor there started him on clonidine 0.1 mg BID.   Patient lives with his mother. Is not currently working, states that it is hard to find a job.       11/10/2023   12:29 PM  Depression screen PHQ 2/9  Decreased Interest 0  Down, Depressed, Hopeless 0  PHQ - 2 Score 0  Altered sleeping 0  Tired, decreased energy 0  Change in appetite 0  Feeling bad or failure about yourself  0  Trouble concentrating 0  Moving slowly or fidgety/restless 0  Suicidal thoughts 0  PHQ-9 Score 0  Difficult doing work/chores Not difficult at all      11/10/2023   12:30 PM  GAD 7 : Generalized Anxiety Score  Nervous, Anxious, on Edge 0  Control/stop worrying 0  Worry too much - different things 0  Trouble relaxing 0  Restless 0  Easily annoyed or irritable 0  Afraid - awful might happen 0  Total GAD 7 Score 0  Anxiety Difficulty Not difficult at all         Outpatient Encounter Medications as of 11/10/2023  Medication Sig   cloNIDine (CATAPRES) 0.1 MG tablet Take 0.1 mg by mouth 2 (two) times daily.   QUEtiapine  (SEROQUEL) 50 MG tablet Take 75 mg by mouth at bedtime.   SUBOXONE 8-2 MG FILM SMARTSIG:0.5 Strip(s) Sublingual 3 Times Daily   [DISCONTINUED] acetaminophen  (TYLENOL ) 500 MG tablet Take 1,000 mg by mouth every 6 (six) hours as needed.   [DISCONTINUED] Beta Carotene (VITAMIN A) 25000 UNIT capsule Take 25,000 Units by mouth daily.   [DISCONTINUED] vitamin B-12 (CYANOCOBALAMIN ) 100 MCG tablet Take 100 mcg by mouth daily.   No facility-administered encounter medications on file as of 11/10/2023.    History reviewed. No pertinent past medical history.  Past Surgical History:  Procedure Laterality Date   FEMUR FRACTURE SURGERY Right    rod and 2 screws in right upper leg    Family History  Problem Relation Age of Onset   Depression Mother    COPD Mother    Diabetes Father    Depression Brother     Social History   Socioeconomic History   Marital status: Single    Spouse name: Not on file   Number of children: Not on file   Years of education: Not on file   Highest education level: Not on file  Occupational History   Not on file  Tobacco Use  Smoking status: Every Day    Current packs/day: 1.50    Average packs/day: 1.5 packs/day for 16.0 years (24.0 ttl pk-yrs)    Types: Cigarettes    Start date: 11/10/2007    Passive exposure: Current   Smokeless tobacco: Never  Vaping Use   Vaping status: Never Used  Substance and Sexual Activity   Alcohol use: Yes    Alcohol/week: 24.0 standard drinks of alcohol    Types: 24 Cans of beer per week    Comment: 12 pack daily   Drug use: Not Currently    Types: Methamphetamines, Fentanyl    Comment: pt denies any use of drugs over the last few weeks as of 04/26/21   Sexual activity: Not Currently  Other Topics Concern   Not on file  Social History Narrative   Not on file   Social Drivers of Health   Financial Resource Strain: Low Risk  (11/10/2023)   Overall Financial Resource Strain (CARDIA)    Difficulty of Paying Living  Expenses: Not hard at all  Food Insecurity: No Food Insecurity (11/10/2023)   Hunger Vital Sign    Worried About Running Out of Food in the Last Year: Never true    Ran Out of Food in the Last Year: Never true  Transportation Needs: No Transportation Needs (11/10/2023)   PRAPARE - Administrator, Civil Service (Medical): No    Lack of Transportation (Non-Medical): No  Physical Activity: Not on file  Stress: Not on file  Social Connections: Unknown (06/16/2021)   Received from [facility]   Social Network    Social Network: Not on file  Intimate Partner Violence: Not At Risk (11/10/2023)   Humiliation, Afraid, Rape, and Kick questionnaire    Fear of Current or Ex-Partner: No    Emotionally Abused: No    Physically Abused: No    Sexually Abused: No    ROS      Objective    BP 118/73   Pulse (!) 114   Temp (!) 97 F (36.1 C)   Ht 5' 3 (1.6 m)   Wt 211 lb 9.6 oz (96 kg)   SpO2 97%   BMI 37.48 kg/m   Physical Exam Vitals reviewed.  Constitutional:      Appearance: He is obese.     Comments: Clothes dirty  HENT:     Head: Normocephalic and atraumatic.     Right Ear: Tympanic membrane, ear canal and external ear normal.     Left Ear: Tympanic membrane, ear canal and external ear normal.     Nose: Nose normal.     Mouth/Throat:     Mouth: Mucous membranes are moist.     Pharynx: Oropharynx is clear.     Comments: Dental caries to multiple teeth Eyes:     Extraocular Movements: Extraocular movements intact.     Conjunctiva/sclera: Conjunctivae normal.     Pupils: Pupils are equal, round, and reactive to light.  Cardiovascular:     Rate and Rhythm: Normal rate and regular rhythm.     Pulses: Normal pulses.     Heart sounds: Normal heart sounds. No murmur heard. Pulmonary:     Effort: Pulmonary effort is normal.     Breath sounds: Normal breath sounds.  Musculoskeletal:        General: Normal range of motion.     Cervical back: Normal range of motion  and neck supple.  Skin:    General: Skin is warm and dry.  Neurological:     General: No focal deficit present.     Mental Status: He is alert and oriented to person, place, and time.  Psychiatric:        Mood and Affect: Mood normal.        Behavior: Behavior normal.         Assessment & Plan:   Problem List Items Addressed This Visit   None Visit Diagnoses       Hypertension, unspecified type    -  Primary   Relevant Medications   cloNIDine (CATAPRES) 0.1 MG tablet     History of substance abuse (HCC)         BMI 37.0-37.9, adult          History of opioid misuse-patient to continue with this current rehab center [facility].  They are currently treating him with Suboxone and Seroquel daily.    Hypertension - blood pressure within normal limits today.  Patient to continue clonidine 0.1 mg twice daily.  Will plan to recheck blood pressure at the next appointment.  Offered routine screening labs today but the patient declined them.  He states that he will think about it and might do them at his next appointment with me.  Recommend getting established with a dentist.  Current smoker-patient reports that he is not ready to stop smoking.  He reports that he does have patches and gum to help stop smoking if he chooses to use them, he states that his rehab center can prescribe these for him.   Return in about 4 weeks (around 12/08/2023). For annual wellness check up.   Oneil LELON Severin, FNP

## 2023-12-13 ENCOUNTER — Encounter: Payer: Self-pay | Admitting: Family Medicine

## 2023-12-13 ENCOUNTER — Ambulatory Visit: Payer: MEDICAID | Admitting: Family Medicine

## 2023-12-13 VITALS — BP 143/90 | HR 98 | Temp 97.7°F | Ht 63.0 in | Wt 211.0 lb

## 2023-12-13 DIAGNOSIS — K029 Dental caries, unspecified: Secondary | ICD-10-CM | POA: Diagnosis not present

## 2023-12-13 DIAGNOSIS — Z0001 Encounter for general adult medical examination with abnormal findings: Secondary | ICD-10-CM

## 2023-12-13 DIAGNOSIS — I10 Essential (primary) hypertension: Secondary | ICD-10-CM | POA: Diagnosis not present

## 2023-12-13 DIAGNOSIS — F17200 Nicotine dependence, unspecified, uncomplicated: Secondary | ICD-10-CM

## 2023-12-13 DIAGNOSIS — Z136 Encounter for screening for cardiovascular disorders: Secondary | ICD-10-CM

## 2023-12-13 DIAGNOSIS — F1911 Other psychoactive substance abuse, in remission: Secondary | ICD-10-CM

## 2023-12-13 DIAGNOSIS — Z130 Encounter for screening for diseases of the blood and blood-forming organs and certain disorders involving the immune mechanism: Secondary | ICD-10-CM

## 2023-12-13 DIAGNOSIS — Z0000 Encounter for general adult medical examination without abnormal findings: Secondary | ICD-10-CM

## 2023-12-13 DIAGNOSIS — Z6837 Body mass index (BMI) 37.0-37.9, adult: Secondary | ICD-10-CM

## 2023-12-13 DIAGNOSIS — G479 Sleep disorder, unspecified: Secondary | ICD-10-CM

## 2023-12-13 DIAGNOSIS — Z1329 Encounter for screening for other suspected endocrine disorder: Secondary | ICD-10-CM

## 2023-12-13 MED ORDER — LISINOPRIL 5 MG PO TABS
5.0000 mg | ORAL_TABLET | Freq: Every day | ORAL | 0 refills | Status: DC
Start: 2023-12-13 — End: 2023-12-13

## 2023-12-13 MED ORDER — LISINOPRIL 5 MG PO TABS: 5.0000 mg | ORAL_TABLET | Freq: Every day | ORAL | 0 refills | Status: DC

## 2023-12-13 MED ORDER — LISINOPRIL 5 MG PO TABS: 5.0000 mg | ORAL_TABLET | Freq: Every day | ORAL | 0 refills | Status: AC

## 2023-12-13 NOTE — Progress Notes (Signed)
 Annual Wellness Visit   Patient: Barry Randolph, Male    DOB: 1990-12-17, 33 y.o.   MRN: [id number]  Chief Complaint  Patient presents with   Annual Exam   1 Month BP Follow Up    Patient says his substance abuse provider recommended that he stop taking Clonidine and start taking BP medication.    Subjective:    Barry Randolph is a 33 y.o. male who presents today for his Annual Wellness Visit. He reports consuming a general diet. The patient does not participate in regular exercise at present. He generally feels well. He reports sleeping well. He does not have additional problems to discuss today.   HPI  Discussed the use of AI scribe software for clinical note transcription with the patient, who gave verbal consent to proceed.  History of Present Illness   Barry Randolph is a 33 year old male with hypertension who presents for a yearly physical exam and blood pressure management.  Hypertension management - Currently taking clonidine prescribed by rehab specialists. Hx of substance use.  - No home blood pressure cuff; blood pressure checked every two weeks at rehab - Rehab specialist recommended stopping clonidine and starting a different blood pressure medication - Did not take clonidine today  Sleep disturbance - Taking Seroquel nightly, which improves sleep quality. Managed by rehab specialists  Substance use - Takes Suboxone - Smokes cigarettes; has access to nicotine  patches and lozenges but not using them regularly  Nutritional status - Eats variety of foods  Dental health - Recent broken tooth - Has called to set up dental appointment     Patient denies any concerns.       Medications: Outpatient Medications Prior to Visit  Medication Sig   cloNIDine (CATAPRES) 0.1 MG tablet Take 0.1 mg by mouth 2 (two) times daily.   QUEtiapine (SEROQUEL) 50 MG tablet Take 75 mg by mouth at bedtime.   SUBOXONE 8-2 MG FILM SMARTSIG:0.5 Strip(s) Sublingual 3 Times  Daily   No facility-administered medications prior to visit.    Allergies  Allergen Reactions   Sulfa Antibiotics Hives    Patient Care Team: Alcus Oneil ORN, FNP as PCP - General (Family Medicine)  ROS   Objective:  Objective  BP (!) 143/90   Pulse 98   Temp 97.7 F (36.5 C)   Ht 5' 3 (1.6 m)   Wt 211 lb (95.7 kg)   SpO2 97%   BMI 37.38 kg/m    Physical Exam Vitals reviewed.  Constitutional:      Appearance: Normal appearance. He is obese.     Comments: Clothes are dirty appearing  HENT:     Head: Normocephalic and atraumatic.     Right Ear: Tympanic membrane, ear canal and external ear normal.     Left Ear: Tympanic membrane, ear canal and external ear normal.     Nose: Nose normal.     Mouth/Throat:     Mouth: Mucous membranes are moist.     Pharynx: Oropharynx is clear.     Comments: Dark spots to several teeth. Poor overall dental hygiene Eyes:     Extraocular Movements: Extraocular movements intact.     Conjunctiva/sclera: Conjunctivae normal.     Pupils: Pupils are equal, round, and reactive to light.  Neck:     Thyroid: No thyroid mass or thyromegaly.  Cardiovascular:     Rate and Rhythm: Normal rate and regular rhythm.     Pulses: Normal pulses.  Heart sounds: Normal heart sounds. No murmur heard. Pulmonary:     Effort: Pulmonary effort is normal. No respiratory distress.     Breath sounds: Normal breath sounds.  Abdominal:     Palpations: Abdomen is soft. There is no mass.  Musculoskeletal:        General: No deformity. Normal range of motion.     Cervical back: Normal range of motion and neck supple.  Skin:    General: Skin is warm and dry.     Capillary Refill: Capillary refill takes less than 2 seconds.  Neurological:     General: No focal deficit present.     Mental Status: He is alert and oriented to person, place, and time.  Psychiatric:        Mood and Affect: Mood normal.        Behavior: Behavior normal.        Most  recent depression screenings:    12/13/2023   11:14 AM 11/10/2023   12:29 PM  PHQ 2/9 Scores  PHQ - 2 Score 0 0  PHQ- 9 Score 0 0      Data saved with a previous flowsheet row definition    Fall Screening Falls in the past year?: 0 Number of falls in past year: 0 Was there an injury with Fall?: 0 Fall Risk Category Calculator: 0 Patient Fall Risk Level: Low Fall Risk  Fall Risk Patient at Risk for Falls Due to: No Fall Risks Fall risk Follow up: Falls evaluation completed    Vision/Hearing Screen: No results found.   No results found for any visits on 12/13/23.   Assessment & Plan:    Annual wellness visit done today including the all of the following: Reviewed patient's Family and Medical History Reviewed and updated list of patient's medical providers Assessment of cognitive impairment was done Assessed patient's functional ability Established a written schedule for health screening services Health Risk Assessment Completed and Reviewed  Exercise Activities and Dietary recommendations  Goals   None     Immunization History  Administered Date(s) Administered   Hepatitis B, PED/ADOLESCENT 11/01/2000, 12/06/2000, 04/18/2001   Influenza-Unspecified 11/07/2007   Td 06/13/2014    Health Maintenance  Topic Date Due   Influenza Vaccine  05/01/2024 (Originally 09/02/2023)   Pneumococcal Vaccine (1 of 2 - PCV) 11/09/2024 (Originally 08/18/2008)   HPV VACCINES (1 - 3-dose SCDM series) 11/09/2024 (Originally 08/18/2016)   COVID-19 Vaccine (1 - 2025-26 season) 12/12/2024 (Originally 10/03/2023)   DTaP/Tdap/Td (2 - Tdap) 06/12/2024   Hepatitis B Vaccines 19-59 Average Risk  Completed   Hepatitis C Screening  Completed   HIV Screening  Completed   Meningococcal B Vaccine  Aged Out    Discussed health benefits of physical activity, and encouraged him to engage in regular exercise appropriate for his age and condition.    Problem List Items Addressed This Visit    None Visit Diagnoses       Annual physical exam    -  Primary     Hypertension, unspecified type       Relevant Medications   lisinopril (ZESTRIL) 5 MG tablet   Other Relevant Orders   Lipid Panel   Microalbumin/Creatinine Ratio, Urine     Dental caries         History of substance abuse (HCC)         BMI 37.0-37.9, adult         Current smoker         Screening  for endocrine, nutritional, metabolic and immunity disorder       Relevant Orders   Bayer DCA Hb A1c Waived   CBC with Differential   Comprehensive metabolic panel with GFR     Encounter for screening for cardiovascular disorders       Relevant Orders   Lipid Panel     Sleep difficulties          Patient declined all vaccines today.   Assessment and Plan    Adult Wellness Visit BMI elevated.  - Encouraged regular exercise and healthy eating. - Schedule lab work in a couple weeks  - [facility].  Essential hypertension Blood pressure elevated. Discussed switching medication, side effects, and importance of monitoring. - Discontinued clonidine. - Prescribed lisinopril 5 mg/day - Obtain home BP monitor  - Monitor blood pressure at home or rehab facility. - Adjust medication if blood pressure remains over 130/80. - Patient to check BP at home daily x 1 week and let me know the results.  - Patient declined lab work today, states that he does not like needles. Discussed importance of obtaining lab work, particularly with initiation of BP meds. Patient voiced understanding and reported that he would schedule appointment to come back and get labs done.    Tobacco use Continues smoking. Has nicotine  patches and lozenges but not using regularly. Discussed cessation support. - Encouraged use of nicotine  patches and lozenges.  Substance use - Continue f/u with rehab specialist. - Continue suboxone as prescribed by rehab specialist.  Dental problems Reports broken tooth and gum  disease.  - Continue with plan to make dental appointment  - Discussed importance of brushing teeth and flossing regularly.        Return in about 2 weeks (around 12/27/2023) for blood work.     Oneil LELON Severin, FNP [facility]

## 2024-01-12 ENCOUNTER — Other Ambulatory Visit: Payer: MEDICAID

## 2024-01-20 ENCOUNTER — Other Ambulatory Visit: Payer: MEDICAID

## 2024-01-20 DIAGNOSIS — Z136 Encounter for screening for cardiovascular disorders: Secondary | ICD-10-CM

## 2024-01-20 DIAGNOSIS — I10 Essential (primary) hypertension: Secondary | ICD-10-CM

## 2024-01-20 DIAGNOSIS — Z130 Encounter for screening for diseases of the blood and blood-forming organs and certain disorders involving the immune mechanism: Secondary | ICD-10-CM

## 2024-01-20 LAB — LIPID PANEL

## 2024-01-20 LAB — BAYER DCA HB A1C WAIVED: HB A1C (BAYER DCA - WAIVED): 5.2 % (ref 4.8–5.6)

## 2024-01-21 LAB — COMPREHENSIVE METABOLIC PANEL WITH GFR
ALT: 73 IU/L — AB (ref 0–44)
AST: 87 IU/L — AB (ref 0–40)
Albumin: 4.7 g/dL (ref 4.1–5.1)
Alkaline Phosphatase: 64 IU/L (ref 47–123)
BUN/Creatinine Ratio: 7 — AB (ref 9–20)
BUN: 5 mg/dL — AB (ref 6–20)
Bilirubin Total: 0.5 mg/dL (ref 0.0–1.2)
CO2: 20 mmol/L (ref 20–29)
Calcium: 9.6 mg/dL (ref 8.7–10.2)
Chloride: 96 mmol/L (ref 96–106)
Creatinine, Ser: 0.67 mg/dL — AB (ref 0.76–1.27)
Globulin, Total: 2.9 g/dL (ref 1.5–4.5)
Glucose: 104 mg/dL — AB (ref 70–99)
Potassium: 3.9 mmol/L (ref 3.5–5.2)
Sodium: 136 mmol/L (ref 134–144)
Total Protein: 7.6 g/dL (ref 6.0–8.5)
eGFR: 126 mL/min/1.73

## 2024-01-21 LAB — LIPID PANEL
Cholesterol, Total: 253 mg/dL — AB (ref 100–199)
HDL: 46 mg/dL
LDL CALC COMMENT:: 5.5 ratio — AB (ref 0.0–5.0)
LDL Chol Calc (NIH): 168 mg/dL — AB (ref 0–99)
Triglycerides: 211 mg/dL — AB (ref 0–149)
VLDL Cholesterol Cal: 39 mg/dL (ref 5–40)

## 2024-01-21 LAB — CBC WITH DIFFERENTIAL/PLATELET
Basophils Absolute: 0.1 x10E3/uL (ref 0.0–0.2)
Basos: 2 %
EOS (ABSOLUTE): 0.2 x10E3/uL (ref 0.0–0.4)
Eos: 2 %
Hematocrit: 44.1 % (ref 37.5–51.0)
Hemoglobin: 15.6 g/dL (ref 13.0–17.7)
Immature Grans (Abs): 0.1 x10E3/uL (ref 0.0–0.1)
Immature Granulocytes: 1 %
Lymphocytes Absolute: 3.7 x10E3/uL — ABNORMAL HIGH (ref 0.7–3.1)
Lymphs: 39 %
MCH: 34 pg — ABNORMAL HIGH (ref 26.6–33.0)
MCHC: 35.4 g/dL (ref 31.5–35.7)
MCV: 96 fL (ref 79–97)
Monocytes Absolute: 1.1 x10E3/uL — ABNORMAL HIGH (ref 0.1–0.9)
Monocytes: 11 %
Neutrophils Absolute: 4.1 x10E3/uL (ref 1.4–7.0)
Neutrophils: 45 %
Platelets: 257 x10E3/uL (ref 150–450)
RBC: 4.59 x10E6/uL (ref 4.14–5.80)
RDW: 12.8 % (ref 11.6–15.4)
WBC: 9.3 x10E3/uL (ref 3.4–10.8)

## 2024-01-24 ENCOUNTER — Ambulatory Visit: Payer: Self-pay | Admitting: Family Medicine

## 2024-01-24 ENCOUNTER — Other Ambulatory Visit: Payer: Self-pay | Admitting: Family Medicine

## 2024-01-24 DIAGNOSIS — R748 Abnormal levels of other serum enzymes: Secondary | ICD-10-CM

## 2024-02-01 ENCOUNTER — Ambulatory Visit (HOSPITAL_COMMUNITY): Payer: MEDICAID

## 2024-02-08 ENCOUNTER — Ambulatory Visit (HOSPITAL_COMMUNITY): Payer: MEDICAID | Attending: Family Medicine

## 2024-04-25 ENCOUNTER — Ambulatory Visit: Payer: MEDICAID | Admitting: Family Medicine
